# Patient Record
Sex: Male | Born: 1960 | Race: Black or African American | Hispanic: No | Smoking: Never smoker
Health system: Southern US, Community
[De-identification: ages and names within clinical notes are randomized; demographics above are authoritative.]

## PROBLEM LIST (undated history)

## (undated) DIAGNOSIS — E1165 Type 2 diabetes mellitus with hyperglycemia: Secondary | ICD-10-CM

## (undated) DIAGNOSIS — I10 Essential (primary) hypertension: Secondary | ICD-10-CM

## (undated) DIAGNOSIS — E785 Hyperlipidemia, unspecified: Secondary | ICD-10-CM

## (undated) DIAGNOSIS — IMO0002 Reserved for concepts with insufficient information to code with codable children: Secondary | ICD-10-CM

## (undated) DIAGNOSIS — T7840XA Allergy, unspecified, initial encounter: Secondary | ICD-10-CM

## (undated) HISTORY — DX: Type 2 diabetes mellitus with hyperglycemia: E11.65

## (undated) HISTORY — DX: Hyperlipidemia, unspecified: E78.5

## (undated) HISTORY — DX: Essential (primary) hypertension: I10

## (undated) HISTORY — PX: NO PAST SURGERIES: SHX2092

## (undated) HISTORY — DX: Reserved for concepts with insufficient information to code with codable children: IMO0002

## (undated) HISTORY — DX: Allergy, unspecified, initial encounter: T78.40XA

---

## 2014-08-15 ENCOUNTER — Ambulatory Visit (INDEPENDENT_AMBULATORY_CARE_PROVIDER_SITE_OTHER): Payer: BLUE CROSS/BLUE SHIELD | Admitting: Internal Medicine

## 2014-08-15 VITALS — BP 126/78 | HR 75 | Temp 97.8°F | Resp 18 | Ht 73.5 in | Wt 221.0 lb

## 2014-08-15 DIAGNOSIS — Z136 Encounter for screening for cardiovascular disorders: Secondary | ICD-10-CM

## 2014-08-15 DIAGNOSIS — Z1211 Encounter for screening for malignant neoplasm of colon: Secondary | ICD-10-CM

## 2014-08-15 DIAGNOSIS — Z125 Encounter for screening for malignant neoplasm of prostate: Secondary | ICD-10-CM

## 2014-08-15 DIAGNOSIS — Z130 Encounter for screening for diseases of the blood and blood-forming organs and certain disorders involving the immune mechanism: Secondary | ICD-10-CM

## 2014-08-15 DIAGNOSIS — Z0000 Encounter for general adult medical examination without abnormal findings: Secondary | ICD-10-CM

## 2014-08-15 DIAGNOSIS — Z13220 Encounter for screening for lipoid disorders: Secondary | ICD-10-CM

## 2014-08-15 DIAGNOSIS — Z23 Encounter for immunization: Secondary | ICD-10-CM

## 2014-08-15 LAB — POCT URINALYSIS DIPSTICK
Bilirubin, UA: NEGATIVE
Blood, UA: NEGATIVE
GLUCOSE UA: NEGATIVE
KETONES UA: NEGATIVE
LEUKOCYTES UA: NEGATIVE
NITRITE UA: NEGATIVE
Protein, UA: NEGATIVE
SPEC GRAV UA: 1.02
Urobilinogen, UA: 0.2
pH, UA: 7

## 2014-08-15 LAB — POCT CBC
Granulocyte percent: 52.3 %G (ref 37–80)
HEMATOCRIT: 44.8 % (ref 43.5–53.7)
HEMOGLOBIN: 14.6 g/dL (ref 14.1–18.1)
Lymph, poc: 2.7 (ref 0.6–3.4)
MCH: 28.8 pg (ref 27–31.2)
MCHC: 32.5 g/dL (ref 31.8–35.4)
MCV: 88.6 fL (ref 80–97)
MID (CBC): 0.5 (ref 0–0.9)
MPV: 7.2 fL (ref 0–99.8)
POC Granulocyte: 3.6 (ref 2–6.9)
POC LYMPH PERCENT: 40.3 %L (ref 10–50)
POC MID %: 7.4 % (ref 0–12)
Platelet Count, POC: 237 10*3/uL (ref 142–424)
RBC: 5.06 M/uL (ref 4.69–6.13)
RDW, POC: 13.9 %
WBC: 6.8 10*3/uL (ref 4.6–10.2)

## 2014-08-15 LAB — COMPREHENSIVE METABOLIC PANEL
ALBUMIN: 4.4 g/dL (ref 3.5–5.2)
ALK PHOS: 46 U/L (ref 39–117)
ALT: 43 U/L (ref 0–53)
AST: 25 U/L (ref 0–37)
BILIRUBIN TOTAL: 0.6 mg/dL (ref 0.2–1.2)
BUN: 11 mg/dL (ref 6–23)
CALCIUM: 9.4 mg/dL (ref 8.4–10.5)
CHLORIDE: 101 meq/L (ref 96–112)
CO2: 26 mEq/L (ref 19–32)
Creat: 0.91 mg/dL (ref 0.50–1.35)
GLUCOSE: 79 mg/dL (ref 70–99)
Potassium: 4 mEq/L (ref 3.5–5.3)
Sodium: 138 mEq/L (ref 135–145)
TOTAL PROTEIN: 7.8 g/dL (ref 6.0–8.3)

## 2014-08-15 LAB — LIPID PANEL
Cholesterol: 169 mg/dL (ref 0–200)
HDL: 43 mg/dL (ref >39)
LDL Cholesterol: 98 mg/dL (ref 0–99)
TRIGLYCERIDES: 139 mg/dL (ref <150)
Total CHOL/HDL Ratio: 3.9 Ratio
VLDL: 28 mg/dL (ref 0–40)

## 2014-08-15 LAB — POCT UA - MICROSCOPIC ONLY
Bacteria, U Microscopic: NEGATIVE
Casts, Ur, LPF, POC: NEGATIVE
Crystals, Ur, HPF, POC: NEGATIVE
EPITHELIAL CELLS, URINE PER MICROSCOPY: NEGATIVE
MUCUS UA: NEGATIVE
RBC, urine, microscopic: NEGATIVE
WBC, UR, HPF, POC: NEGATIVE
Yeast, UA: NEGATIVE

## 2014-08-15 LAB — TSH: TSH: 8.323 u[IU]/mL — ABNORMAL HIGH (ref 0.350–4.500)

## 2014-08-15 NOTE — Progress Notes (Signed)
Subjective:    Patient ID: Larry Mclean, male    DOB: 05-May-1961, 54 y.o.   MRN: [id number]  HPI Healthy hx. From [place], [place]. Has lost weight exercising.   Review of Systems  Constitutional: Negative.   HENT: Negative.   Eyes: Negative.   Respiratory: Negative.   Cardiovascular: Negative.   Gastrointestinal: Negative.   Endocrine: Negative.   Genitourinary: Negative.   Musculoskeletal: Negative.   Skin: Negative.   Allergic/Immunologic: Negative.   Neurological: Negative.   Hematological: Negative.   Psychiatric/Behavioral: Negative.        Objective:   Physical Exam  Constitutional: He is oriented to person, place, and time. He appears well-developed and well-nourished. No distress.  HENT:  Head: Normocephalic and atraumatic.  Right Ear: External ear normal.  Left Ear: External ear normal.  Nose: Nose normal.  Mouth/Throat: Oropharynx is clear and moist.  Eyes: Conjunctivae and EOM are normal. Pupils are equal, round, and reactive to light.  Neck: Normal range of motion. Neck supple. No tracheal deviation present. No thyromegaly present.  Cardiovascular: Normal rate, regular rhythm, normal heart sounds and intact distal pulses.   No murmur heard. Pulmonary/Chest: Effort normal and breath sounds normal. He exhibits no tenderness.  Abdominal: Soft. Bowel sounds are normal. He exhibits mass. There is no tenderness.  Genitourinary: Rectum normal, prostate normal and penis normal.  Musculoskeletal: Normal range of motion.  Lymphadenopathy:    He has no cervical adenopathy.  Neurological: He is alert and oriented to person, place, and time. No cranial nerve deficit. He exhibits normal muscle tone. Coordination normal.  Skin: No rash noted.  Psychiatric: He has a normal mood and affect. His behavior is normal. Judgment and thought content normal.  Vitals reviewed.  EKG normal  Results for orders placed or performed in visit on 08/15/14  POCT CBC  Result  Value Ref Range   WBC 6.8 4.6 - 10.2 K/uL   Lymph, poc 2.7 0.6 - 3.4   POC LYMPH PERCENT 40.3 10 - 50 %L   MID (cbc) 0.5 0 - 0.9   POC MID % 7.4 0 - 12 %M   POC Granulocyte 3.6 2 - 6.9   Granulocyte percent 52.3 37 - 80 %G   RBC 5.06 4.69 - 6.13 M/uL   Hemoglobin 14.6 14.1 - 18.1 g/dL   HCT, POC 40.9 81.1 - 53.7 %   MCV 88.6 80 - 97 fL   MCH, POC 28.8 27 - 31.2 pg   MCHC 32.5 31.8 - 35.4 g/dL   RDW, POC 91.4 %   Platelet Count, POC 237 142 - 424 K/uL   MPV 7.2 0 - 99.8 fL  POCT UA - Microscopic Only  Result Value Ref Range   WBC, Ur, HPF, POC neg    RBC, urine, microscopic neg    Bacteria, U Microscopic neg    Mucus, UA neg    Epithelial cells, urine per micros neg    Crystals, Ur, HPF, POC neg    Casts, Ur, LPF, POC neg    Yeast, UA neg   POCT urinalysis dipstick  Result Value Ref Range   Color, UA yellow    Clarity, UA clear    Glucose, UA neg    Bilirubin, UA neg    Ketones, UA neg    Spec Grav, UA 1.020    Blood, UA neg    pH, UA 7.0    Protein, UA neg    Urobilinogen, UA 0.2    Nitrite,  UA neg    Leukocytes, UA Negative           Assessment & Plan:  Healthy exam

## 2014-08-15 NOTE — Patient Instructions (Signed)
Colonoscopy A colonoscopy is an exam to look at the entire large intestine (colon). This exam can help find problems such as tumors, polyps, inflammation, and areas of bleeding. The exam takes about 1 hour.  LET [facility] CARE PROVIDER KNOW ABOUT:   Any allergies you have.  All medicines you are taking, including vitamins, herbs, eye drops, creams, and over-the-counter medicines.  Previous problems you or members of your family have had with the use of anesthetics.  Any blood disorders you have.  Previous surgeries you have had.  Medical conditions you have. RISKS AND COMPLICATIONS  Generally, this is a safe procedure. However, as with any procedure, complications can occur. Possible complications include:  Bleeding.  Tearing or rupture of the colon wall.  Reaction to medicines given during the exam.  Infection (rare). BEFORE THE PROCEDURE   Ask your health care provider about changing or stopping your regular medicines.  You may be prescribed an oral bowel prep. This involves drinking a large amount of medicated liquid, starting the day before your procedure. The liquid will cause you to have multiple loose stools until your stool is almost clear or light green. This cleans out your colon in preparation for the procedure.  Do not eat or drink anything else once you have started the bowel prep, unless your health care provider tells you it is safe to do so.  Arrange for someone to drive you home after the procedure. PROCEDURE   You will be given medicine to help you relax (sedative).  You will lie on your side with your knees bent.  A long, flexible tube with a light and camera on the end (colonoscope) will be inserted through the rectum and into the colon. The camera sends video back to a computer screen as it moves through the colon. The colonoscope also releases carbon dioxide gas to inflate the colon. This helps your health care provider see the area better.  During  the exam, your health care provider may take a small tissue sample (biopsy) to be examined under a microscope if any abnormalities are found.  The exam is finished when the entire colon has been viewed. AFTER THE PROCEDURE   Do not drive for 24 hours after the exam.  You may have a small amount of blood in your stool.  You may pass moderate amounts of gas and have mild abdominal cramping or bloating. This is caused by the gas used to inflate your colon during the exam.  Ask when your test results will be ready and how you will get your results. Make sure you get your test results. Document Released: 07/11/2000 Document Revised: 05/04/2013 Document Reviewed: 03/21/2013 [facility] Patient Information 2015 [place], [place]. This information is not intended to replace advice given to you by your health care provider. Make sure you discuss any questions you have with your health care provider. DASH Eating Plan DASH stands for "Dietary Approaches to Stop Hypertension." The DASH eating plan is a healthy eating plan that has been shown to reduce high blood pressure (hypertension). Additional health benefits may include reducing the risk of type 2 diabetes mellitus, heart disease, and stroke. The DASH eating plan may also help with weight loss. WHAT DO I NEED TO KNOW ABOUT THE DASH EATING PLAN? For the DASH eating plan, you will follow these general guidelines:  Choose foods with a percent daily value for sodium of less than 5% (as listed on the food label).  Use salt-free seasonings or herbs instead of  table salt or sea salt.  Check with your health care provider or pharmacist before using salt substitutes.  Eat lower-sodium products, often labeled as "lower sodium" or "no salt added."  Eat fresh foods.  Eat more vegetables, fruits, and low-fat dairy products.  Choose whole grains. Look for the word "whole" as the first word in the ingredient list.  Choose fish and skinless chicken or [place]  more often than red meat. Limit fish, poultry, and meat to 6 oz (170 g) each day.  Limit sweets, desserts, sugars, and sugary drinks.  Choose heart-healthy fats.  Limit cheese to 1 oz (28 g) per day.  Eat more home-cooked food and less restaurant, buffet, and fast food.  Limit fried foods.  Cook foods using methods other than frying.  Limit canned vegetables. If you do use them, rinse them well to decrease the sodium.  When eating at a restaurant, ask that your food be prepared with less salt, or no salt if possible. WHAT FOODS CAN I EAT? Seek help from a dietitian for individual calorie needs. Grains Whole grain or whole wheat bread. Brown rice. Whole grain or whole wheat pasta. Quinoa, bulgur, and whole grain cereals. Low-sodium cereals. Corn or whole wheat flour tortillas. Whole grain cornbread. Whole grain crackers. Low-sodium crackers. Vegetables Fresh or frozen vegetables (raw, steamed, roasted, or grilled). Low-sodium or reduced-sodium tomato and vegetable juices. Low-sodium or reduced-sodium tomato sauce and paste. Low-sodium or reduced-sodium canned vegetables.  Fruits All fresh, canned (in natural juice), or frozen fruits. Meat and Other Protein Products Ground beef (85% or leaner), grass-fed beef, or beef trimmed of fat. Skinless chicken or [place]. Ground chicken or [place]. Pork trimmed of fat. All fish and seafood. Eggs. Dried beans, peas, or lentils. Unsalted nuts and seeds. Unsalted canned beans. Dairy Low-fat dairy products, such as skim or 1% milk, 2% or reduced-fat cheeses, low-fat ricotta or cottage cheese, or plain low-fat yogurt. Low-sodium or reduced-sodium cheeses. Fats and Oils Tub margarines without trans fats. Light or reduced-fat mayonnaise and salad dressings (reduced sodium). Avocado. Safflower, olive, or canola oils. Natural peanut or almond butter. Other Unsalted popcorn and pretzels. The items listed above may not be a complete list of recommended foods  or beverages. Contact your dietitian for more options. WHAT FOODS ARE NOT RECOMMENDED? Grains White bread. White pasta. White rice. Refined cornbread. Bagels and croissants. Crackers that contain trans fat. Vegetables Creamed or fried vegetables. Vegetables in a cheese sauce. Regular canned vegetables. Regular canned tomato sauce and paste. Regular tomato and vegetable juices. Fruits Dried fruits. Canned fruit in light or heavy syrup. Fruit juice. Meat and Other Protein Products Fatty cuts of meat. Ribs, chicken wings, bacon, sausage, bologna, salami, chitterlings, fatback, hot dogs, bratwurst, and packaged luncheon meats. Salted nuts and seeds. Canned beans with salt. Dairy Whole or 2% milk, cream, half-and-half, and cream cheese. Whole-fat or sweetened yogurt. Full-fat cheeses or blue cheese. Nondairy creamers and whipped toppings. Processed cheese, cheese spreads, or cheese curds. Condiments Onion and garlic salt, seasoned salt, table salt, and sea salt. Canned and packaged gravies. Worcestershire sauce. Tartar sauce. Barbecue sauce. Teriyaki sauce. Soy sauce, including reduced sodium. Steak sauce. Fish sauce. Oyster sauce. Cocktail sauce. Horseradish. Ketchup and mustard. Meat flavorings and tenderizers. Bouillon cubes. Hot sauce. Tabasco sauce. Marinades. Taco seasonings. Relishes. Fats and Oils Butter, stick margarine, lard, shortening, ghee, and bacon fat. Coconut, palm kernel, or palm oils. Regular salad dressings. Other Pickles and olives. Salted popcorn and pretzels. The items listed above may not  be a complete list of foods and beverages to avoid. Contact your dietitian for more information. WHERE CAN I FIND MORE INFORMATION? National Heart, Lung, and Blood Institute: CablePromo.itwww.nhlbi.nih.gov/health/health-topics/topics/dash/ Document Released: 07/03/2011 Document Revised: 11/28/2013 Document Reviewed: 05/18/2013 [facility] Patient Information 2015 [place], [place]. This information is not  intended to replace advice given to you by your health care provider. Make sure you discuss any questions you have with your health care provider.

## 2014-08-16 LAB — PSA: PSA: 0.59 ng/mL (ref <=4.00)

## 2014-10-04 ENCOUNTER — Ambulatory Visit (INDEPENDENT_AMBULATORY_CARE_PROVIDER_SITE_OTHER): Payer: BLUE CROSS/BLUE SHIELD | Admitting: Family Medicine

## 2014-10-04 VITALS — BP 120/80 | HR 78 | Temp 98.0°F | Resp 16 | Ht 75.25 in | Wt 228.0 lb

## 2014-10-04 DIAGNOSIS — S39011A Strain of muscle, fascia and tendon of abdomen, initial encounter: Secondary | ICD-10-CM

## 2014-10-04 DIAGNOSIS — R946 Abnormal results of thyroid function studies: Secondary | ICD-10-CM

## 2014-10-04 DIAGNOSIS — K0510 Chronic gingivitis, plaque induced: Secondary | ICD-10-CM

## 2014-10-04 DIAGNOSIS — K036 Deposits [accretions] on teeth: Secondary | ICD-10-CM | POA: Diagnosis not present

## 2014-10-04 DIAGNOSIS — R7989 Other specified abnormal findings of blood chemistry: Secondary | ICD-10-CM

## 2014-10-04 LAB — TSH: TSH: 4.4 u[IU]/mL (ref 0.350–4.500)

## 2014-10-04 LAB — T3, FREE: T3, Free: 3.4 pg/mL (ref 2.3–4.2)

## 2014-10-04 LAB — T4, FREE: FREE T4: 1 ng/dL (ref 0.80–1.80)

## 2014-10-04 NOTE — Patient Instructions (Signed)
I will be in touch with your labs asap I think that the pain in your side is due to a muscle strain from your cough.  The blood in your mouth is probably coming from your gums. Your gums and teeth need care by a dentist as soon as yo ucan!   Please contact the dentist of your choice- I have been pleased with my dentist:   [facility] [address] [place], [place] [address] Telephone: [phone]   In the meantime please brush 2-3 time a day with a soft toothbrush and floss daily, use mouthwash.

## 2014-10-04 NOTE — Progress Notes (Signed)
[facility] [address], [place] [address] [phone]  Date:  10/04/2014   Name:  Larry Mclean Heidrick   DOB:  1961-07-09   MRN:  [id number]  PCP:  No PCP Per Patient    Chief Complaint: Flank Pain and Oral Pain   History of Present Illness:  Larry Mclean Beeck is a 54 y.o. very pleasant male patient who presents with the following:  Seen here in January and noted to have an elevated TSH at 8.2.  He was not stared on medication, planned to recheck in 2 months or so.  Will do this for him today.   He also notes a left lower abdominal / flank pain. He only notes it in the morning when he wakes up.  He also notes pain here when he coughs or sneezes.  He has noted this for about one month; it seemed to start when he was ill and was coughing and sneezing a lot.   When he is up and working he feels fine.    He also has noted that he has a lot of spit in his mouth when he wakes up, and there is sometimes blood.  He is not sure if his gums are bleeding.  He has noted this for about one month as well.  All of this seemed to start when he had a cold about a month ago.   He is no longer coughing.    There are no active problems to display for this patient.   Past Medical History  Diagnosis Date  . Allergy     History reviewed. No pertinent past surgical history.  History  Substance Use Topics  . Smoking status: Never Smoker   . Smokeless tobacco: Not on file  . Alcohol Use: No    History reviewed. No pertinent family history.  No Known Allergies  Medication list has been reviewed and updated.  No current outpatient prescriptions on file prior to visit.   No current facility-administered medications on file prior to visit.    Review of Systems:  As per HPI- otherwise negative.   Physical Examination: Filed Vitals:   10/04/14 1626  BP: 120/80  Pulse: 78  Temp: 98 F (36.7 C)  Resp: 16   Filed Vitals:   10/04/14 1626  Height: 6' 3.25" (1.911 m)   Weight: 228 lb (103.42 kg)   Body mass index is 28.32 kg/(m^2). Ideal Body Weight: Weight in (lb) to have BMI = 25: 200.9  GEN: WDWN, NAD, Non-toxic, A & O x 3 HEENT: Atraumatic, Normocephalic. Neck supple. No masses, No LAD.  Bilateral TM wnl, oropharynx normal.  PEERL,EOMI.   He has significant plaque build-up at his gumline and his gums are red and inflamed.   Ears and Nose: No external deformity. CV: RRR, No M/G/R. No JVD. No thrill. No extra heart sounds. PULM: CTA B, no wheezes, crackles, rhonchi. No retractions. No resp. distress. No accessory muscle use. ABD: S, NT, ND, +BS. No rebound. No HSM. EXTR: No c/c/e NEURO Normal gait.  PSYCH: Normally interactive. Conversant. Not depressed or anxious appearing.  Calm demeanor.  Unable to reproduce pain in his side or flank on exam  Assessment and Plan: Elevated TSH - Plan: TSH, T3, Free, T4, Free  Dental plaque  Gum inflammation  Abdominal muscle strain, initial encounter  Await labs.  Reassured about his abdominal strain, asked him to let me know if not better soon Encouraged him to see a dentist asap, and  discussed oral hygiene to help calm down his gum inflammation prior to his visit   Signed Abbe Amsterdam, MD

## 2014-10-05 ENCOUNTER — Encounter: Payer: Self-pay | Admitting: Family Medicine

## 2019-10-03 ENCOUNTER — Ambulatory Visit: Payer: Self-pay | Attending: Internal Medicine

## 2020-02-04 ENCOUNTER — Encounter (HOSPITAL_COMMUNITY): Payer: Self-pay

## 2020-02-04 ENCOUNTER — Inpatient Hospital Stay (HOSPITAL_COMMUNITY)
Admission: EM | Admit: 2020-02-04 | Discharge: 2020-02-09 | DRG: 638 | Disposition: A | Discharge status: Home / Self Care | Payer: 59 | Attending: Internal Medicine | Admitting: Internal Medicine

## 2020-02-04 ENCOUNTER — Inpatient Hospital Stay (HOSPITAL_COMMUNITY): Payer: 59

## 2020-02-04 DIAGNOSIS — E0810 Diabetes mellitus due to underlying condition with ketoacidosis without coma: Secondary | ICD-10-CM

## 2020-02-04 DIAGNOSIS — E785 Hyperlipidemia, unspecified: Secondary | ICD-10-CM | POA: Diagnosis present

## 2020-02-04 DIAGNOSIS — N179 Acute kidney failure, unspecified: Secondary | ICD-10-CM | POA: Diagnosis present

## 2020-02-04 DIAGNOSIS — Z23 Encounter for immunization: Secondary | ICD-10-CM

## 2020-02-04 DIAGNOSIS — E1010 Type 1 diabetes mellitus with ketoacidosis without coma: Principal | ICD-10-CM | POA: Diagnosis present

## 2020-02-04 DIAGNOSIS — E1110 Type 2 diabetes mellitus with ketoacidosis without coma: Secondary | ICD-10-CM | POA: Diagnosis present

## 2020-02-04 DIAGNOSIS — I10 Essential (primary) hypertension: Secondary | ICD-10-CM | POA: Diagnosis present

## 2020-02-04 DIAGNOSIS — Z7984 Long term (current) use of oral hypoglycemic drugs: Secondary | ICD-10-CM

## 2020-02-04 DIAGNOSIS — Z20822 Contact with and (suspected) exposure to covid-19: Secondary | ICD-10-CM | POA: Diagnosis present

## 2020-02-04 DIAGNOSIS — E876 Hypokalemia: Secondary | ICD-10-CM | POA: Diagnosis not present

## 2020-02-04 LAB — BLOOD GAS, ARTERIAL
Acid-base deficit: 29.3 mmol/L — ABNORMAL HIGH (ref 0.0–2.0)
Acid-base deficit: 19.6 mmol/L — ABNORMAL HIGH (ref 0.0–2.0)
Allens test (pass/fail): POSITIVE — AB
Bicarbonate: 2.4 mmol/L — ABNORMAL LOW (ref 20.0–28.0)
Bicarbonate: 6.4 mmol/L — ABNORMAL LOW (ref 20.0–28.0)
Drawn by: 308601
FIO2: 21
O2 Saturation: 96.9 %
O2 Saturation: 97.4 %
Patient temperature: 98.6
Patient temperature: 98.6
pCO2 arterial: 9.3 mmHg — CL (ref 32.0–48.0)
pCO2 arterial: 15.2 mmHg — CL (ref 32.0–48.0)
pH, Arterial: 7.033 — CL (ref 7.350–7.450)
pH, Arterial: 7.245 — ABNORMAL LOW (ref 7.350–7.450)
pO2, Arterial: 140 mmHg — ABNORMAL HIGH (ref 83.0–108.0)
pO2, Arterial: 122 mmHg — ABNORMAL HIGH (ref 83.0–108.0)

## 2020-02-04 LAB — CBC WITH DIFFERENTIAL/PLATELET
Abs Immature Granulocytes: 0.46 10*3/uL — ABNORMAL HIGH (ref 0.00–0.07)
Basophils Absolute: 0.1 10*3/uL (ref 0.0–0.1)
Basophils Relative: 1 %
Eosinophils Absolute: 0 10*3/uL (ref 0.0–0.5)
Eosinophils Relative: 0 %
HCT: 49.9 % (ref 39.0–52.0)
Hemoglobin: 16.1 g/dL (ref 13.0–17.0)
Immature Granulocytes: 3 %
Lymphocytes Relative: 13 %
Lymphs Abs: 2.4 10*3/uL (ref 0.7–4.0)
MCH: 28.9 pg (ref 26.0–34.0)
MCHC: 32.3 g/dL (ref 30.0–36.0)
MCV: 89.6 fL (ref 80.0–100.0)
Monocytes Absolute: 0.9 10*3/uL (ref 0.1–1.0)
Monocytes Relative: 5 %
Neutro Abs: 14.7 10*3/uL — ABNORMAL HIGH (ref 1.7–7.7)
Neutrophils Relative %: 78 %
Platelets: 324 10*3/uL (ref 150–400)
RBC: 5.57 MIL/uL (ref 4.22–5.81)
RDW: 13.7 % (ref 11.5–15.5)
WBC: 18.5 10*3/uL — ABNORMAL HIGH (ref 4.0–10.5)
nRBC: 0 % (ref 0.0–0.2)

## 2020-02-04 LAB — I-STAT CHEM 8, ED
BUN: 18 mg/dL (ref 6–20)
Calcium, Ion: 1.18 mmol/L (ref 1.15–1.40)
Chloride: 113 mmol/L — ABNORMAL HIGH (ref 98–111)
Creatinine, Ser: 1 mg/dL (ref 0.61–1.24)
Glucose, Bld: 275 mg/dL — ABNORMAL HIGH (ref 70–99)
HCT: 51 % (ref 39.0–52.0)
Hemoglobin: 17.3 g/dL — ABNORMAL HIGH (ref 13.0–17.0)
Potassium: 5.1 mmol/L (ref 3.5–5.1)
Sodium: 139 mmol/L (ref 135–145)
TCO2: 8 mmol/L — ABNORMAL LOW (ref 22–32)

## 2020-02-04 LAB — BLOOD GAS, VENOUS
Acid-base deficit: 26.4 mmol/L — ABNORMAL HIGH (ref 0.0–2.0)
Bicarbonate: 5.9 mmol/L — ABNORMAL LOW (ref 20.0–28.0)
O2 Saturation: 43.8 %
Patient temperature: 98.6
pCO2, Ven: 26.1 mmHg — ABNORMAL LOW (ref 44.0–60.0)
pH, Ven: 6.981 — CL (ref 7.250–7.430)
pO2, Ven: 31.3 mmHg — CL (ref 32.0–45.0)

## 2020-02-04 LAB — BASIC METABOLIC PANEL
Anion gap: 21 — ABNORMAL HIGH (ref 5–15)
Anion gap: 18 — ABNORMAL HIGH (ref 5–15)
BUN: 13 mg/dL (ref 6–20)
BUN: 13 mg/dL (ref 6–20)
CO2: 8 mmol/L — ABNORMAL LOW (ref 22–32)
CO2: 10 mmol/L — ABNORMAL LOW (ref 22–32)
Calcium: 8.7 mg/dL — ABNORMAL LOW (ref 8.9–10.3)
Calcium: 8.3 mg/dL — ABNORMAL LOW (ref 8.9–10.3)
Chloride: 115 mmol/L — ABNORMAL HIGH (ref 98–111)
Chloride: 115 mmol/L — ABNORMAL HIGH (ref 98–111)
Creatinine, Ser: 1.31 mg/dL — ABNORMAL HIGH (ref 0.61–1.24)
Creatinine, Ser: 1.23 mg/dL (ref 0.61–1.24)
GFR calc Af Amer: >60 mL/min (ref >60)
GFR calc Af Amer: >60 mL/min (ref >60)
GFR calc non Af Amer: 59 mL/min — ABNORMAL LOW (ref >60)
GFR calc non Af Amer: >60 mL/min (ref >60)
Glucose, Bld: 136 mg/dL — ABNORMAL HIGH (ref 70–99)
Glucose, Bld: 151 mg/dL — ABNORMAL HIGH (ref 70–99)
Potassium: 4.6 mmol/L (ref 3.5–5.1)
Potassium: 4.1 mmol/L (ref 3.5–5.1)
Sodium: 144 mmol/L (ref 135–145)
Sodium: 143 mmol/L (ref 135–145)

## 2020-02-04 LAB — CBC
HCT: 50.3 % (ref 39.0–52.0)
Hemoglobin: 16.3 g/dL (ref 13.0–17.0)
MCH: 29.2 pg (ref 26.0–34.0)
MCHC: 32.4 g/dL (ref 30.0–36.0)
MCV: 90 fL (ref 80.0–100.0)
Platelets: 242 10*3/uL (ref 150–400)
RBC: 5.59 MIL/uL (ref 4.22–5.81)
RDW: 13.8 % (ref 11.5–15.5)
WBC: 17.2 10*3/uL — ABNORMAL HIGH (ref 4.0–10.5)
nRBC: 0 % (ref 0.0–0.2)

## 2020-02-04 LAB — HEMOGLOBIN A1C
Hgb A1c MFr Bld: 14.5 % — ABNORMAL HIGH (ref 4.8–5.6)
Mean Plasma Glucose: 369.45 mg/dL

## 2020-02-04 LAB — COMPREHENSIVE METABOLIC PANEL
ALT: 11 U/L (ref 0–44)
ALT: 15 U/L (ref 0–44)
AST: 26 U/L (ref 15–41)
AST: 17 U/L (ref 15–41)
Albumin: 4.5 g/dL (ref 3.5–5.0)
Albumin: 3.9 g/dL (ref 3.5–5.0)
Alkaline Phosphatase: 77 U/L (ref 38–126)
Alkaline Phosphatase: 73 U/L (ref 38–126)
Anion gap: 21 — ABNORMAL HIGH (ref 5–15)
BUN: 14 mg/dL (ref 6–20)
BUN: 13 mg/dL (ref 6–20)
CO2: <7 mmol/L — ABNORMAL LOW (ref 22–32)
CO2: 6 mmol/L — ABNORMAL LOW (ref 22–32)
Calcium: 9.3 mg/dL (ref 8.9–10.3)
Calcium: 8.4 mg/dL — ABNORMAL LOW (ref 8.9–10.3)
Chloride: 106 mmol/L (ref 98–111)
Chloride: 114 mmol/L — ABNORMAL HIGH (ref 98–111)
Creatinine, Ser: 1.71 mg/dL — ABNORMAL HIGH (ref 0.61–1.24)
Creatinine, Ser: 1.22 mg/dL (ref 0.61–1.24)
GFR calc Af Amer: 50 mL/min — ABNORMAL LOW (ref >60)
GFR calc Af Amer: >60 mL/min (ref >60)
GFR calc non Af Amer: 43 mL/min — ABNORMAL LOW (ref >60)
GFR calc non Af Amer: >60 mL/min (ref >60)
Glucose, Bld: 277 mg/dL — ABNORMAL HIGH (ref 70–99)
Glucose, Bld: 206 mg/dL — ABNORMAL HIGH (ref 70–99)
Potassium: 5.3 mmol/L — ABNORMAL HIGH (ref 3.5–5.1)
Potassium: 4.8 mmol/L (ref 3.5–5.1)
Sodium: 136 mmol/L (ref 135–145)
Sodium: 141 mmol/L (ref 135–145)
Total Bilirubin: 2.1 mg/dL — ABNORMAL HIGH (ref 0.3–1.2)
Total Bilirubin: 1.3 mg/dL — ABNORMAL HIGH (ref 0.3–1.2)
Total Protein: 9.2 g/dL — ABNORMAL HIGH (ref 6.5–8.1)
Total Protein: 8.4 g/dL — ABNORMAL HIGH (ref 6.5–8.1)

## 2020-02-04 LAB — SARS CORONAVIRUS 2 BY RT PCR (HOSPITAL ORDER, PERFORMED IN ~~LOC~~ HOSPITAL LAB): SARS Coronavirus 2: NEGATIVE

## 2020-02-04 LAB — CBG MONITORING, ED
Glucose-Capillary: 261 mg/dL — ABNORMAL HIGH (ref 70–99)
Glucose-Capillary: 229 mg/dL — ABNORMAL HIGH (ref 70–99)

## 2020-02-04 LAB — GLUCOSE, CAPILLARY
Glucose-Capillary: 145 mg/dL — ABNORMAL HIGH (ref 70–99)
Glucose-Capillary: 144 mg/dL — ABNORMAL HIGH (ref 70–99)
Glucose-Capillary: 150 mg/dL — ABNORMAL HIGH (ref 70–99)
Glucose-Capillary: 139 mg/dL — ABNORMAL HIGH (ref 70–99)
Glucose-Capillary: 176 mg/dL — ABNORMAL HIGH (ref 70–99)

## 2020-02-04 LAB — MAGNESIUM: Magnesium: 2.3 mg/dL (ref 1.7–2.4)

## 2020-02-04 LAB — LACTIC ACID, PLASMA
Lactic Acid, Venous: 0.6 mmol/L (ref 0.5–1.9)
Lactic Acid, Venous: 2.4 mmol/L (ref 0.5–1.9)

## 2020-02-04 LAB — PHOSPHORUS: Phosphorus: 4.3 mg/dL (ref 2.5–4.6)

## 2020-02-04 LAB — LIPASE, BLOOD: Lipase: 27 U/L (ref 11–51)

## 2020-02-04 LAB — PROCALCITONIN: Procalcitonin: <0.1 ng/mL

## 2020-02-04 LAB — MRSA PCR SCREENING: MRSA by PCR: NEGATIVE

## 2020-02-04 LAB — BETA-HYDROXYBUTYRIC ACID
Beta-Hydroxybutyric Acid: >8 mmol/L — ABNORMAL HIGH (ref 0.05–0.27)
Beta-Hydroxybutyric Acid: 2.71 mmol/L — ABNORMAL HIGH (ref 0.05–0.27)

## 2020-02-04 LAB — HIV ANTIBODY (ROUTINE TESTING W REFLEX): HIV Screen 4th Generation wRfx: NONREACTIVE

## 2020-02-04 MED ORDER — INSULIN REGULAR(HUMAN) IN NACL 100-0.9 UT/100ML-% IV SOLN
INTRAVENOUS | Status: DC
  Administered 2020-02-04: 4 [IU]/h via INTRAVENOUS
  Filled 2020-02-04: qty 100

## 2020-02-04 MED ORDER — DEXTROSE 50 % IV SOLN: 0.0000 mL | INTRAVENOUS | Status: DC | PRN

## 2020-02-04 MED ORDER — SODIUM CHLORIDE 0.9 % IV SOLN: INTRAVENOUS | Status: DC

## 2020-02-04 MED ORDER — STERILE WATER FOR INJECTION IV SOLN
INTRAVENOUS | Status: DC
  Filled 2020-02-04: qty 150
  Filled 2020-02-04: qty 50
  Filled 2020-02-04 (×2): qty 150
  Filled 2020-02-04: qty 850

## 2020-02-04 MED ORDER — SODIUM CHLORIDE 0.9 % IV BOLUS (SEPSIS)
1000.0000 mL | Freq: Once | INTRAVENOUS | Status: AC
  Administered 2020-02-04: 1000 mL via INTRAVENOUS

## 2020-02-04 MED ORDER — PNEUMOCOCCAL VAC POLYVALENT 25 MCG/0.5ML IJ INJ
0.5000 mL | INJECTION | INTRAMUSCULAR | Status: AC
  Administered 2020-02-09: 0.5 mL via INTRAMUSCULAR
  Filled 2020-02-04 (×2): qty 0.5

## 2020-02-04 MED ORDER — POLYETHYLENE GLYCOL 3350 17 G PO PACK: 17.0000 g | PACK | Freq: Every day | ORAL | Status: DC | PRN

## 2020-02-04 MED ORDER — CHLORHEXIDINE GLUCONATE CLOTH 2 % EX PADS
6.0000 | MEDICATED_PAD | Freq: Every day | CUTANEOUS | Status: DC
  Administered 2020-02-05 – 2020-02-07 (×3): 6 via TOPICAL

## 2020-02-04 MED ORDER — PANTOPRAZOLE SODIUM 40 MG IV SOLR
40.0000 mg | Freq: Every day | INTRAVENOUS | Status: DC
  Administered 2020-02-04 – 2020-02-08 (×5): 40 mg via INTRAVENOUS
  Filled 2020-02-04 (×5): qty 40

## 2020-02-04 MED ORDER — DEXTROSE-NACL 5-0.45 % IV SOLN: INTRAVENOUS | Status: DC

## 2020-02-04 MED ORDER — SODIUM BICARBONATE 8.4 % IV SOLN
50.0000 meq | Freq: Once | INTRAVENOUS | Status: AC
  Administered 2020-02-04: 50 meq via INTRAVENOUS
  Filled 2020-02-04: qty 50

## 2020-02-04 MED ORDER — INSULIN REGULAR(HUMAN) IN NACL 100-0.9 UT/100ML-% IV SOLN: INTRAVENOUS | Status: DC

## 2020-02-04 MED ORDER — SODIUM CHLORIDE 0.9 % IV SOLN: 1000.0000 mL | INTRAVENOUS | Status: DC

## 2020-02-04 MED ORDER — SODIUM CHLORIDE 0.45 % IV BOLUS: 1000.0000 mL | Freq: Once | INTRAVENOUS | Status: DC

## 2020-02-04 MED ORDER — DOCUSATE SODIUM 100 MG PO CAPS: 100.0000 mg | ORAL_CAPSULE | Freq: Two times a day (BID) | ORAL | Status: DC | PRN

## 2020-02-04 MED ORDER — ENOXAPARIN SODIUM 40 MG/0.4ML ~~LOC~~ SOLN
40.0000 mg | SUBCUTANEOUS | Status: DC
  Administered 2020-02-04 – 2020-02-05 (×2): 40 mg via SUBCUTANEOUS
  Filled 2020-02-04 (×2): qty 0.4

## 2020-02-04 MED ORDER — STERILE WATER FOR INJECTION IV SOLN: INTRAVENOUS | Status: DC

## 2020-02-04 NOTE — ED Notes (Signed)
Attempted to call report to ICU, nurse is in a contact room and unable to take report at this time. Asked to call back in a few minutes.

## 2020-02-04 NOTE — ED Notes (Signed)
Was going to attempt report at this time, purple man appeared reading 1240. Will call then.

## 2020-02-04 NOTE — ED Provider Notes (Signed)
Angiocath insertion Performed by: Tilden Fossa  Consent: Verbal consent obtained. Risks and benefits: risks, benefits and alternatives were discussed Time out: Immediately prior to procedure a "time out" was called to verify the correct patient, procedure, equipment, support staff and site/side marked as required.  Preparation: Patient was prepped and draped in the usual sterile fashion.  Vein Location: right hand   Gauge: 20  Normal blood return and flush without difficulty Patient tolerance: Patient tolerated the procedure well with no immediate complications.      Tilden Fossa, MD 02/04/20 [id number]

## 2020-02-04 NOTE — H&P (Signed)
NAME:  Larry Mclean, MRN:  [id number], DOB:  1961/06/10, LOS: 0 ADMISSION DATE:  02/04/2020, CONSULTATION DATE:  02/04/2020 REFERRING MD:  ED doc, CHIEF COMPLAINT: Emesis and feeling poorly  Brief History   59 year old gentleman who was recently diagnosed with diabetes Started on new medications which he started taking about Thursday.  Has been feeling poorly since Thursday Not able to keep food down, weakness denies any fevers, no chills No contact with anybody was been sick When EMS made contact his blood sugar was 260 at the time-was noted to be lethargic, tachycardic and tachypneic  Past Medical History   Past Medical History:  Diagnosis Date  . Allergy   Diabetes   Significant Hospital Events     Consults:  Critical care  Procedures:    Significant Diagnostic Tests:  Lab work significant for severe acidosis  Micro Data:  Blood culture 7/10  Antimicrobials:  None  Interim history/subjective:  Being fluid resuscitated Is awake and alert Complains of just feeling poorly generally  Objective   Blood pressure (!) 149/98, pulse (!) 112, temperature (!) 97 F (36.1 C), temperature source Axillary, resp. rate (!) 27, height 6\' 4"  (1.93 m), weight 95.7 kg, SpO2 99 %.       No intake or output data in the 24 hours ending 02/04/20 1343 Filed Weights   02/04/20 1204  Weight: 95.7 kg    Examination: General: Middle-age gentleman, tachypneic HENT: Dry oral mucosa Lungs: Clear breath sounds bilaterally Cardiovascular: S1-S2 appreciated Abdomen: Soft, bowel sounds appreciated Extremities: No clubbing, no edema Neuro: Awake and alert, oriented x3, moving all extremities GU:   Resolved Hospital Problem list     Assessment & Plan:  Diabetic ketoacidosis -Endo tool management -Already received over 3 L of fluids -Elevated beta hydroxybutyrate- -Severe acidosis -Normal lactate level -Anion gap greater than 20  Type 2 diabetes -Hold oral  medications  Severe metabolic acidosis -Received bicarbonate drip in the ED -Repeat ABG pending  Hyperlipidemia -Will follow  Continue aggressive fluid resuscitation Monitor electrolytes Chest x-ray pending Cultures ordered Urinalysis and cultures  Best practice:  Diet: N.p.o. at present Pain/Anxiety/Delirium protocol (if indicated):  VAP protocol (if indicated):  DVT prophylaxis: SCD, Lovenox GI prophylaxis: Protonix Glucose control: Endo tool Mobility: Bedrest Code Status: Full code Family Communication: Will update Disposition: ICU Labs   CBC: Recent Labs  Lab 02/04/20 1040 02/04/20 1048  WBC 18.5*  --   NEUTROABS 14.7*  --   HGB 16.1 17.3*  HCT 49.9 51.0  MCV 89.6  --   PLT 324  --     Basic Metabolic Panel: Recent Labs  Lab 02/04/20 1040 02/04/20 1048  NA 136 139  K 5.3* 5.1  CL 106 113*  CO2 <7*  --   GLUCOSE 277* 275*  BUN 14 18  CREATININE 1.71* 1.00  CALCIUM 9.3  --   MG 2.3  --    GFR: Estimated Creatinine Clearance: 97.7 mL/min (by C-G formula based on SCr of 1 mg/dL). Recent Labs  Lab 02/04/20 1040 02/04/20 1235  WBC 18.5*  --   LATICACIDVEN  --  0.6    Liver Function Tests: Recent Labs  Lab 02/04/20 1040  AST 26  ALT 11  ALKPHOS 77  BILITOT 2.1*  PROT 9.2*  ALBUMIN 4.5   Recent Labs  Lab 02/04/20 1040  LIPASE 27   No results for input(s): AMMONIA in the last 168 hours.  ABG    Component Value Date/Time   HCO3 5.9 (L)  02/04/2020 1040   TCO2 8 (L) 02/04/2020 1048   ACIDBASEDEF 26.4 (H) 02/04/2020 1040   O2SAT 43.8 02/04/2020 1040     Coagulation Profile: No results for input(s): INR, PROTIME in the last 168 hours.  Cardiac Enzymes: No results for input(s): CKTOTAL, CKMB, CKMBINDEX, TROPONINI in the last 168 hours.  HbA1C: No results found for: HGBA1C  CBG: Recent Labs  Lab 02/04/20 0951 02/04/20 1147  GLUCAP 261* 229*    Review of Systems:   Abdominal discomfort Fatigue  Past Medical History   He,  has a past medical history of Allergy.   Surgical History   History reviewed. No pertinent surgical history.   Social History   reports that he has never smoked. He does not have any smokeless tobacco history on file. He reports that he does not drink alcohol and does not use drugs.   Family History   His family history is not on file.   Allergies No Known Allergies   Home Medications  Prior to Admission medications   Medication Sig Start Date End Date Taking? Authorizing Provider  atorvastatin (LIPITOR) 40 MG tablet Take 40 mg by mouth daily.   Yes [provider]  empagliflozin (JARDIANCE) 25 MG TABS tablet Take 25 mg by mouth daily.   Yes [provider]  metFORMIN (GLUCOPHAGE) 1000 MG tablet Take 1,000 mg by mouth 2 (two) times daily with a meal.   Yes [provider]    The patient is critically ill with multiple organ systems failure and requires high complexity decision making for assessment and support, frequent evaluation and titration of therapies, application of advanced monitoring technologies and extensive interpretation of multiple databases. Critical Care Time devoted to patient care services described in this note independent of APP/resident time (if applicable)  is 40 minutes.   Virl Diamond MD [facility] Personal pager: [phone] If unanswered, please page CCM On-call: #[phone]

## 2020-02-04 NOTE — ED Notes (Signed)
CRITICAL VALUE STICKER  CRITICAL VALUE: VBG pH 6.981; O2- 31.3  RECEIVER (on-site recipient of call): CSimpson, RN   DATE & TIME NOTIFIED: 02/04/20 1114  MESSENGER (representative from lab): Geoffery Spruce  MD NOTIFIED: Madilyn Hook  TIME OF NOTIFICATION: 1116  RESPONSE:  See orders

## 2020-02-04 NOTE — Progress Notes (Signed)
CRITICAL VALUE ALERT  Critical Value:  Ph 7.0  Date & Time Notied:  02/04/2020 1347  Provider Notified: Virl Diamond  Orders Received/Actions taken: sodium bicarb

## 2020-02-04 NOTE — ED Triage Notes (Signed)
Pt presents with c/o vomiting for approx 3 days. Pt is a diabetic. CBG was 260 per EMS. Pt is tachypneic at this time, also c/o abdominal pain. 4mg  Zofran given en route by EMS, 150 ml of NS given.

## 2020-02-04 NOTE — ED Provider Notes (Signed)
[facility]-EMERGENCY DEPT Provider Note   CSN: [id number] Arrival date & time: 02/04/20  4562     History Chief Complaint  Patient presents with  . Emesis    Larry Mclean is a 59 y.o. male with a past medical history significant for diabetes who presents to the ED due to 3 days of nausea and vomiting associated with upper abdominal pain.  He admits to mild blood in his emesis after numerous episodes of nonbloody emesis.  Denies fever and chills.  Denies sick contacts and known Covid exposures.  He notes he has not taken his diabetic medication since Thursday because it causes abdominal pain.  Patient was given 4 mg of Zofran and 150 mL of normal saline in route to the hospital. Glucose per EMS was 260. Patient very lethargic, tachycardic, and tachypneic on initial evaluation.   Level 5 caveat secondary to acuity of condition.  History obtained from patient and past medical records. No interpreter used during encounter.      Past Medical History:  Diagnosis Date  . Allergy     Patient Active Problem List   Diagnosis Date Noted  . DKA (diabetic ketoacidoses) (HCC) 02/04/2020    History reviewed. No pertinent surgical history.     History reviewed. No pertinent family history.  Social History   Tobacco Use  . Smoking status: Never Smoker  Substance Use Topics  . Alcohol use: No    Alcohol/week: 0.0 standard drinks  . Drug use: No    Home Medications Prior to Admission medications   Not on File    Allergies    Patient has no known allergies.  Review of Systems   Review of Systems  Constitutional: Negative for chills and fever.  Respiratory: Negative for shortness of breath.   Cardiovascular: Negative for chest pain.  Gastrointestinal: Positive for abdominal pain, nausea and vomiting. Negative for diarrhea.  All other systems reviewed and are negative.   Physical Exam Updated Vital Signs BP (!) 170/99   Pulse (!) 119   Temp (!) 97  F (36.1 C) (Axillary)   Resp (!) 30   Ht 6\' 4"  (1.93 m)   Wt 95.7 kg   SpO2 100%   BMI 25.68 kg/m   Physical Exam Vitals and nursing note reviewed.  Constitutional:      General: He is in acute distress.     Appearance: He is ill-appearing.     Comments: Lethargic on exam  HENT:     Head: Normocephalic.  Eyes:     Pupils: Pupils are equal, round, and reactive to light.  Cardiovascular:     Rate and Rhythm: Regular rhythm. Tachycardia present.     Pulses: Normal pulses.     Heart sounds: Normal heart sounds. No murmur heard.  No friction rub. No gallop.   Pulmonary:     Breath sounds: Normal breath sounds.     Comments: Tachypneic at 32. Abdominal:     General: Abdomen is flat. Bowel sounds are normal. There is no distension.     Palpations: Abdomen is soft.     Tenderness: There is no abdominal tenderness. There is no guarding or rebound.  Musculoskeletal:     Cervical back: Neck supple.     Comments: No lower extremity edema.  Skin:    General: Skin is warm and dry.  Neurological:     General: No focal deficit present.  Psychiatric:        Mood and Affect: Mood normal.  Behavior: Behavior normal.     ED Results / Procedures / Treatments   Labs (all labs ordered are listed, but only abnormal results are displayed) Labs Reviewed  CBC WITH DIFFERENTIAL/PLATELET - Abnormal; Notable for the following components:      Result Value   WBC 18.5 (*)    Neutro Abs 14.7 (*)    Abs Immature Granulocytes 0.46 (*)    All other components within normal limits  COMPREHENSIVE METABOLIC PANEL - Abnormal; Notable for the following components:   Potassium 5.3 (*)    CO2 <7 (*)    Glucose, Bld 277 (*)    Creatinine, Ser 1.71 (*)    Total Protein 9.2 (*)    Total Bilirubin 2.1 (*)    GFR calc non Af Amer 43 (*)    GFR calc Af Amer 50 (*)    All other components within normal limits  BLOOD GAS, VENOUS - Abnormal; Notable for the following components:   pH, Ven 6.981  (*)    pCO2, Ven 26.1 (*)    pO2, Ven 31.3 (*)    Bicarbonate 5.9 (*)    Acid-base deficit 26.4 (*)    All other components within normal limits  BETA-HYDROXYBUTYRIC ACID - Abnormal; Notable for the following components:   Beta-Hydroxybutyric Acid >8.00 (*)    All other components within normal limits  CBG MONITORING, ED - Abnormal; Notable for the following components:   Glucose-Capillary 261 (*)    All other components within normal limits  I-STAT CHEM 8, ED - Abnormal; Notable for the following components:   Chloride 113 (*)    Glucose, Bld 275 (*)    TCO2 8 (*)    Hemoglobin 17.3 (*)    All other components within normal limits  CBG MONITORING, ED - Abnormal; Notable for the following components:   Glucose-Capillary 229 (*)    All other components within normal limits  SARS CORONAVIRUS 2 BY RT PCR (HOSPITAL ORDER, PERFORMED IN [facility])  CULTURE, BLOOD (ROUTINE X 2)  CULTURE, BLOOD (ROUTINE X 2)  LIPASE, BLOOD  MAGNESIUM  URINALYSIS, ROUTINE W REFLEX MICROSCOPIC  HIV ANTIBODY (ROUTINE TESTING W REFLEX)  CBC  COMPREHENSIVE METABOLIC PANEL  PHOSPHORUS  LACTIC ACID, PLASMA  LACTIC ACID, PLASMA  PROCALCITONIN  URINALYSIS, ROUTINE W REFLEX MICROSCOPIC  BLOOD GAS, ARTERIAL  BASIC METABOLIC PANEL  BASIC METABOLIC PANEL  BASIC METABOLIC PANEL  BASIC METABOLIC PANEL  BETA-HYDROXYBUTYRIC ACID  BETA-HYDROXYBUTYRIC ACID  HEMOGLOBIN A1C    EKG None  Radiology No results found.  Procedures Procedures (including critical care time) CRITICAL CARE Performed by: Mannie Stabile   Total critical care time: 45 minutes  Critical care time was exclusive of separately billable procedures and treating other patients.  Critical care was necessary to treat or prevent imminent or life-threatening deterioration.  Critical care was time spent personally by me on the following activities: development of treatment plan with patient and/or surrogate as well as  nursing, discussions with consultants, evaluation of patient's response to treatment, examination of patient, obtaining history from patient or surrogate, ordering and performing treatments and interventions, ordering and review of laboratory studies, ordering and review of radiographic studies, pulse oximetry and re-evaluation of patient's condition.  Medications Ordered in ED Medications  sodium chloride 0.9 % bolus 1,000 mL (0 mLs Intravenous Stopped 02/04/20 1139)    Followed by  sodium chloride 0.9 % bolus 1,000 mL (1,000 mLs Intravenous New Bag/Given 02/04/20 1038)    Followed by  sodium chloride 0.9 % bolus 1,000 mL (1,000 mLs Intravenous New Bag/Given 02/04/20 1036)    Followed by  0.9 %  sodium chloride infusion (has no administration in time range)  insulin regular, human (MYXREDLIN) 100 units/ 100 mL infusion (has no administration in time range)  dextrose 5 %-0.45 % sodium chloride infusion (has no administration in time range)  dextrose 50 % solution 0-50 mL (has no administration in time range)  0.9 %  sodium chloride infusion (has no administration in time range)  docusate sodium (COLACE) capsule 100 mg (has no administration in time range)  polyethylene glycol (MIRALAX / GLYCOLAX) packet 17 g (has no administration in time range)  enoxaparin (LOVENOX) injection 40 mg (has no administration in time range)  pantoprazole (PROTONIX) injection 40 mg (has no administration in time range)  0.9 %  sodium chloride infusion ( Intravenous Not Given 02/04/20 1202)  dextrose 5 %-0.45 % sodium chloride infusion ( Intravenous Not Given 02/04/20 1203)  insulin regular, human (MYXREDLIN) 100 units/ 100 mL infusion ( Intravenous Not Given 02/04/20 1203)  dextrose 50 % solution 0-50 mL (has no administration in time range)  sodium bicarbonate injection 50 mEq (50 mEq Intravenous Given 02/04/20 1137)    ED Course  I have reviewed the triage vital signs and the nursing notes.  Pertinent labs &  imaging results that were available during my care of the patient were reviewed by me and considered in my medical decision making (see chart for details).  Clinical Course as of Feb 04 1204  Sat Feb 04, 2020  0958 Glucose-Capillary(!): 261 [CA]  1010 Temp(!): 97 F (36.1 C) [CA]  1010 Pulse Rate(!): 117 [CA]  1010 Resp(!): 32 [CA]  1010 BP(!): 153/96 [CA]  1041 WBC(!): 18.5 [CA]  1104 Glucose(!): 275 [CA]  1105 Chloride(!): 113 [CA]  1116 pH, Ven(!!): 6.981 [CA]  1116 pO2, Ven(!!): 31.3 [CA]  1124 Potassium(!): 5.3 [CA]  1129 Creatinine(!): 1.71 [CA]  1130 Glucose(!): 277 [CA]    Clinical Course User Index [CA] Mannie StabileAberman, Curtez Brallier C, PA-C   MDM Rules/Calculators/A&P                         59 year old male presents to the ED via EMS due to abdominal pain associate with nausea and vomiting for the past 3 days.  Patient has a history of diabetes and has not taken his diabetic medication since Thursday.  Upon arrival patient tachycardic at 117, tachypneic at 32.  Patient is ill-appearing and lethargic on exam. Suspect symptoms related to possible DKA. Routine labs orders and 3L bolus given. Will assess labs then given insulin once potassium level is available. Discussed case with Dr. Madilyn Hookees directly after initial evaluation who evaluated patient and agrees with assessment and plan.   11:03 AM reassessed patient at bedside.  Patient's mentation has drastically improved.  Patient admits to improvement in symptoms.  Patient able to answer questions appropriately.  Vitals have mildly improved however, patient still mildly tachycardic and tachypneic. Will continue to monitor.   VBG suggestive of DKA with severe acidosis with ph at 6.9. CBC significant for leukocytosis at 18.5 likely due to stress reaction.  Discussed case with Dr. Virl DiamondAdewale Olalere with critical care who agrees to admit patient for further treatment. COVID test ordered.   Potassium 5.3. Insulin started.   Final Clinical  Impression(s) / ED Diagnoses Final diagnoses:  Diabetic ketoacidosis without coma associated with type 2 diabetes mellitus (HCC)    Rx /  DC Orders ED Discharge Orders    None       Jesusita Oka 02/04/20 1205    Tilden Fossa, MD 02/06/20 [phone]

## 2020-02-04 NOTE — Progress Notes (Signed)
CRITICAL VALUE ALERT  Critical Value:  lactic  Date & Time Notied:  02/04/2020 1624  Provider Notified: olalere  Orders Received/Actions taken: noted

## 2020-02-04 NOTE — ED Notes (Signed)
PT aware of urine sample. Urinal in hand  

## 2020-02-05 ENCOUNTER — Encounter (HOSPITAL_COMMUNITY): Payer: Self-pay | Admitting: Pulmonary Disease

## 2020-02-05 ENCOUNTER — Other Ambulatory Visit: Payer: Self-pay

## 2020-02-05 LAB — GLUCOSE, CAPILLARY
Glucose-Capillary: 128 mg/dL — ABNORMAL HIGH (ref 70–99)
Glucose-Capillary: 139 mg/dL — ABNORMAL HIGH (ref 70–99)
Glucose-Capillary: 140 mg/dL — ABNORMAL HIGH (ref 70–99)
Glucose-Capillary: 142 mg/dL — ABNORMAL HIGH (ref 70–99)
Glucose-Capillary: 145 mg/dL — ABNORMAL HIGH (ref 70–99)
Glucose-Capillary: 146 mg/dL — ABNORMAL HIGH (ref 70–99)
Glucose-Capillary: 150 mg/dL — ABNORMAL HIGH (ref 70–99)
Glucose-Capillary: 150 mg/dL — ABNORMAL HIGH (ref 70–99)
Glucose-Capillary: 151 mg/dL — ABNORMAL HIGH (ref 70–99)
Glucose-Capillary: 154 mg/dL — ABNORMAL HIGH (ref 70–99)
Glucose-Capillary: 156 mg/dL — ABNORMAL HIGH (ref 70–99)
Glucose-Capillary: 156 mg/dL — ABNORMAL HIGH (ref 70–99)
Glucose-Capillary: 158 mg/dL — ABNORMAL HIGH (ref 70–99)
Glucose-Capillary: 161 mg/dL — ABNORMAL HIGH (ref 70–99)
Glucose-Capillary: 162 mg/dL — ABNORMAL HIGH (ref 70–99)
Glucose-Capillary: 179 mg/dL — ABNORMAL HIGH (ref 70–99)

## 2020-02-05 LAB — BASIC METABOLIC PANEL
Anion gap: 16 — ABNORMAL HIGH (ref 5–15)
Anion gap: 17 — ABNORMAL HIGH (ref 5–15)
Anion gap: 18 — ABNORMAL HIGH (ref 5–15)
Anion gap: 18 — ABNORMAL HIGH (ref 5–15)
BUN: 12 mg/dL (ref 6–20)
BUN: 12 mg/dL (ref 6–20)
BUN: 10 mg/dL (ref 6–20)
BUN: 9 mg/dL (ref 6–20)
CO2: 15 mmol/L — ABNORMAL LOW (ref 22–32)
CO2: 14 mmol/L — ABNORMAL LOW (ref 22–32)
CO2: 13 mmol/L — ABNORMAL LOW (ref 22–32)
CO2: 16 mmol/L — ABNORMAL LOW (ref 22–32)
Calcium: 8.6 mg/dL — ABNORMAL LOW (ref 8.9–10.3)
Calcium: 8.5 mg/dL — ABNORMAL LOW (ref 8.9–10.3)
Calcium: 8.4 mg/dL — ABNORMAL LOW (ref 8.9–10.3)
Calcium: 8.4 mg/dL — ABNORMAL LOW (ref 8.9–10.3)
Chloride: 111 mmol/L (ref 98–111)
Chloride: 110 mmol/L (ref 98–111)
Chloride: 110 mmol/L (ref 98–111)
Chloride: 105 mmol/L (ref 98–111)
Creatinine, Ser: 1.16 mg/dL (ref 0.61–1.24)
Creatinine, Ser: 1.14 mg/dL (ref 0.61–1.24)
Creatinine, Ser: 1 mg/dL (ref 0.61–1.24)
Creatinine, Ser: 0.95 mg/dL (ref 0.61–1.24)
GFR calc Af Amer: >60 mL/min (ref >60)
GFR calc Af Amer: >60 mL/min (ref >60)
GFR calc Af Amer: >60 mL/min (ref >60)
GFR calc Af Amer: >60 mL/min (ref >60)
GFR calc non Af Amer: >60 mL/min (ref >60)
GFR calc non Af Amer: >60 mL/min (ref >60)
GFR calc non Af Amer: >60 mL/min (ref >60)
GFR calc non Af Amer: >60 mL/min (ref >60)
Glucose, Bld: 153 mg/dL — ABNORMAL HIGH (ref 70–99)
Glucose, Bld: 152 mg/dL — ABNORMAL HIGH (ref 70–99)
Glucose, Bld: 160 mg/dL — ABNORMAL HIGH (ref 70–99)
Glucose, Bld: 144 mg/dL — ABNORMAL HIGH (ref 70–99)
Potassium: 3.4 mmol/L — ABNORMAL LOW (ref 3.5–5.1)
Potassium: 3.9 mmol/L (ref 3.5–5.1)
Potassium: 4.1 mmol/L (ref 3.5–5.1)
Potassium: 2.9 mmol/L — ABNORMAL LOW (ref 3.5–5.1)
Sodium: 142 mmol/L (ref 135–145)
Sodium: 141 mmol/L (ref 135–145)
Sodium: 141 mmol/L (ref 135–145)
Sodium: 139 mmol/L (ref 135–145)

## 2020-02-05 LAB — CBC
HCT: 38.8 % — ABNORMAL LOW (ref 39.0–52.0)
Hemoglobin: 13.5 g/dL (ref 13.0–17.0)
MCH: 28.8 pg (ref 26.0–34.0)
MCHC: 34.8 g/dL (ref 30.0–36.0)
MCV: 82.9 fL (ref 80.0–100.0)
Platelets: 238 10*3/uL (ref 150–400)
RBC: 4.68 MIL/uL (ref 4.22–5.81)
RDW: 13.7 % (ref 11.5–15.5)
WBC: 9.7 10*3/uL (ref 4.0–10.5)
nRBC: 0 % (ref 0.0–0.2)

## 2020-02-05 LAB — BLOOD GAS, ARTERIAL
Acid-base deficit: 12.4 mmol/L — ABNORMAL HIGH (ref 0.0–2.0)
Allens test (pass/fail): POSITIVE — AB
Bicarbonate: 11.3 mmol/L — ABNORMAL LOW (ref 20.0–28.0)
Drawn by: 308601
FIO2: 21
O2 Saturation: 96 %
Patient temperature: 98.6
pCO2 arterial: 21.1 mmHg — ABNORMAL LOW (ref 32.0–48.0)
pH, Arterial: 7.348 — ABNORMAL LOW (ref 7.350–7.450)
pO2, Arterial: 111 mmHg — ABNORMAL HIGH (ref 83.0–108.0)

## 2020-02-05 LAB — URINALYSIS, ROUTINE W REFLEX MICROSCOPIC
Bacteria, UA: NONE SEEN
Bilirubin Urine: NEGATIVE
Glucose, UA: >=500 mg/dL — AB
Ketones, ur: 80 mg/dL — AB
Leukocytes,Ua: NEGATIVE
Nitrite: NEGATIVE
Protein, ur: 30 mg/dL — AB
Specific Gravity, Urine: 1.021 (ref 1.005–1.030)
pH: 5 (ref 5.0–8.0)

## 2020-02-05 LAB — BETA-HYDROXYBUTYRIC ACID
Beta-Hydroxybutyric Acid: 7.02 mmol/L — ABNORMAL HIGH (ref 0.05–0.27)
Beta-Hydroxybutyric Acid: 7.51 mmol/L — ABNORMAL HIGH (ref 0.05–0.27)

## 2020-02-05 MED ORDER — POTASSIUM CHLORIDE 10 MEQ/100ML IV SOLN
10.0000 meq | INTRAVENOUS | Status: AC
  Administered 2020-02-05 (×4): 10 meq via INTRAVENOUS
  Filled 2020-02-05 (×4): qty 100

## 2020-02-05 MED ORDER — ORAL CARE MOUTH RINSE
15.0000 mL | Freq: Two times a day (BID) | OROMUCOSAL | Status: DC
  Administered 2020-02-05 – 2020-02-09 (×8): 15 mL via OROMUCOSAL

## 2020-02-05 NOTE — Progress Notes (Signed)
eLink Physician-Brief Progress Note Patient Name: Larry Mclean DOB: 11-16-1960 MRN: [id number]   Date of Service  02/05/2020  HPI/Events of Note  Request for AM ABG. Patient on bicarb drip. clinically stable, still lethargic. Last ABG improved acidosis.  eICU Interventions  New onset DM in DKA Ordered ABG for AM Continue bicarbonate drip for now     Intervention Category Major Interventions: Acid-Base disturbance - evaluation and management  Darl Pikes 02/05/2020, 12:26 AM

## 2020-02-05 NOTE — Progress Notes (Signed)
Inpatient Diabetes Program Recommendations  AACE/ADA: New Consensus Statement on Inpatient Glycemic Control (2015)  Target Ranges:  Prepandial:   less than 140 mg/dL      Peak postprandial:   less than 180 mg/dL (1-2 hours)      Critically ill patients:  140 - 180 mg/dL   Lab Results  Component Value Date   GLUCAP 156 (H) 02/05/2020   HGBA1C 14.5 (H) 02/04/2020    Review of Glycemic Control  Diabetes history: DM2 Outpatient Diabetes medications: Jardiance 25 mg QD, metformin 1000 mg bid Current orders for Inpatient glycemic control: IV insulin per EndoTool for DKA  CO2 - 13    AG - 18 Betahydroxy - 7.51 Not ready for transition at present HgbA1C - 14.5% - uncontrolled  Inpatient Diabetes Program Recommendations:     Continue on IV insulin until criteria met for discontinuation.  See regarding HgbA1C on 7/12.  Thank you. Rhonda Vaughan, RD, LDN, CDE Inpatient Diabetes Coordinator [phone]    

## 2020-02-05 NOTE — Progress Notes (Signed)
NAME:  Larry Mclean, MRN:  [id number], DOB:  January 11, 1961, LOS: 1 ADMISSION DATE:  02/04/2020, CONSULTATION DATE:  02/04/2020 REFERRING MD:  ED doc, CHIEF COMPLAINT: Emesis and feeling poorly  Brief History   59 year old gentleman who was recently diagnosed with diabetes Started on new medications which he started taking about Thursday.  Has been feeling poorly since Thursday Not able to keep food down, weakness denies any fevers, no chills No contact with anybody was been sick When EMS made contact his blood sugar was 260 at the time-was noted to be lethargic, tachycardic and tachypneic  Past Medical History   Past Medical History:  Diagnosis Date  . Allergy   Diabetes   Significant Hospital Events   Mental status is better  Consults:  Critical care  Procedures:  None  Significant Diagnostic Tests:  Lab work significant for severe acidosis  Micro Data:  Blood culture 7/10-negative at present  Antimicrobials:  None  Interim history/subjective:  Being fluid resuscitated Is awake and alert Feeling a lot better  Objective   Blood pressure (!) 143/60, pulse 81, temperature 98 F (36.7 C), temperature source Oral, resp. rate 15, height 6\' 4"  (1.93 m), weight 92.3 kg, SpO2 98 %.        Intake/Output Summary (Last 24 hours) at 02/05/2020 1228 Last data filed at 02/05/2020 1133 Gross per 24 hour  Intake 6668.52 ml  Output 3725 ml  Net 2943.52 ml   Filed Weights   02/04/20 1204 02/05/20 0500  Weight: 95.7 kg 92.3 kg    Examination: General: Middle-age gentleman, tachypneic is better HENT: Dry oral mucosa Lungs: Clear breath sounds bilaterally Cardiovascular: S1-S2 appreciated Abdomen: Soft, bowel sounds appreciated Extremities: No clubbing, no edema Neuro: Awake and alert, oriented x3, moving all extremities GU:   Resolved Hospital Problem list     Assessment & Plan:  Diabetic ketoacidosis -Endo tool management -Continue fluid resuscitation -Elevated  beta hydroxybutyrate-improving -Severe acidosis -Normal lactate level -Anion gap 17  Type 2 diabetes -Hold oral medications  Severe metabolic acidosis -Received bicarbonate drip in the ED -continue bicarb drip  Hyperlipidemia -Will follow  Continue aggressive fluid resuscitation Monitor electrolytes  No evidence of infection  Best practice:  Diet: Liquids Pain/Anxiety/Delirium protocol (if indicated):  VAP protocol (if indicated):  DVT prophylaxis: SCD, Lovenox GI prophylaxis: Protonix Glucose control: Endo tool Mobility: Bedrest Code Status: Full code Family Communication: Will update Disposition: ICU Labs   CBC: Recent Labs  Lab 02/04/20 1040 02/04/20 1048 02/04/20 1341 02/05/20 0522  WBC 18.5*  --  17.2* 9.7  NEUTROABS 14.7*  --   --   --   HGB 16.1 17.3* 16.3 13.5  HCT 49.9 51.0 50.3 38.8*  MCV 89.6  --  90.0 82.9  PLT 324  --  242 238    Basic Metabolic Panel: Recent Labs  Lab 02/04/20 1040 02/04/20 1048 02/04/20 1300 02/04/20 1848 02/04/20 2215 02/05/20 0236 02/05/20 0522  NA 136   < > 141 144 143 142 141  K 5.3*   < > 4.8 4.6 4.1 3.4* 3.9  CL 106   < > 114* 115* 115* 111 110  CO2 <7*   < > 6* 8* 10* 15* 14*  GLUCOSE 277*   < > 206* 136* 151* 153* 152*  BUN 14   < > 13 13 13 12 12   CREATININE 1.71*   < > 1.22 1.31* 1.23 1.16 1.14  CALCIUM 9.3   < > 8.4* 8.7* 8.3* 8.6* 8.5*  MG 2.3  --   --   --   --   --   --  PHOS  --   --  4.3  --   --   --   --    < > = values in this interval not displayed.   GFR: Estimated Creatinine Clearance: 85.7 mL/min (by C-G formula based on SCr of 1.14 mg/dL). Recent Labs  Lab 02/04/20 1040 02/04/20 1235 02/04/20 1341 02/04/20 1546 02/05/20 0522  PROCALCITON  --  <0.10  --   --   --   WBC 18.5*  --  17.2*  --  9.7  LATICACIDVEN  --  0.6  --  2.4*  --     Liver Function Tests: Recent Labs  Lab 02/04/20 1040 02/04/20 1300  AST 26 17  ALT 11 15  ALKPHOS 77 73  BILITOT 2.1* 1.3*  PROT 9.2* 8.4*   ALBUMIN 4.5 3.9   Recent Labs  Lab 02/04/20 1040  LIPASE 27   No results for input(s): AMMONIA in the last 168 hours.  ABG    Component Value Date/Time   PHART 7.348 (L) 02/05/2020 0525   PCO2ART 21.1 (L) 02/05/2020 0525   PO2ART 111 (H) 02/05/2020 0525   HCO3 11.3 (L) 02/05/2020 0525   TCO2 8 (L) 02/04/2020 1048   ACIDBASEDEF 12.4 (H) 02/05/2020 0525   O2SAT 96.0 02/05/2020 0525     Coagulation Profile: No results for input(s): INR, PROTIME in the last 168 hours.  Cardiac Enzymes: No results for input(s): CKTOTAL, CKMB, CKMBINDEX, TROPONINI in the last 168 hours.  HbA1C: Hgb A1c MFr Bld  Date/Time Value Ref Range Status  02/04/2020 12:35 PM 14.5 (H) 4.8 - 5.6 % Final    Comment:    (NOTE) Pre diabetes:          5.7%-6.4%  Diabetes:              >6.4%  Glycemic control for   <7.0% adults with diabetes     CBG: Recent Labs  Lab 02/05/20 0636 02/05/20 0734 02/05/20 0931 02/05/20 1035 02/05/20 1128  GLUCAP 156* 146* 179* 139* 158*    Review of Systems:   Abdominal discomfort Fatigue  Past Medical History  He,  has a past medical history of Allergy.   Surgical History   History reviewed. No pertinent surgical history.   Social History   reports that he has never smoked. He does not have any smokeless tobacco history on file. He reports that he does not drink alcohol and does not use drugs.   Family History   His family history is not on file.   Allergies No Known Allergies   Home Medications  Prior to Admission medications   Medication Sig Start Date End Date Taking? Authorizing Provider  atorvastatin (LIPITOR) 40 MG tablet Take 40 mg by mouth daily.   Yes [provider]  empagliflozin (JARDIANCE) 25 MG TABS tablet Take 25 mg by mouth daily.   Yes [provider]  metFORMIN (GLUCOPHAGE) 1000 MG tablet Take 1,000 mg by mouth 2 (two) times daily with a meal.   Yes [provider]    The patient is critically ill  with multiple organ systems failure and requires high complexity decision making for assessment and support, frequent evaluation and titration of therapies, application of advanced monitoring technologies and extensive interpretation of multiple databases. Critical Care Time devoted to patient care services described in this note independent of APP/resident time (if applicable)  Is 30 minutes.   Virl Diamond MD [facility] Personal pager: [phone] If unanswered, please page CCM On-call: #  [phone]  

## 2020-02-05 NOTE — Progress Notes (Signed)
[facility] ADULT ICU REPLACEMENT PROTOCOL   The patient does apply for the [facility] Adult ICU Electrolyte Replacment Protocol based on the criteria listed below:   1. Is GFR >/= 30 ml/min? Yes.    Patient's GFR today is >60 2. Is SCr </= 2? Yes.   Patient's SCr is 1.6 ml/kg/hr 3. Did SCr increase >/= 0.5 in 24 hours? 4. Abnormal electrolyte(s): K+ 3.4. Ordered repletion with: protocol 6. If a panic level lab has been reported, has the CCM MD in charge been notified? Yes.  .   Physician:  Nathaniel Man  Lolita Lenz 02/05/2020 5:29 AM

## 2020-02-06 DIAGNOSIS — E1110 Type 2 diabetes mellitus with ketoacidosis without coma: Secondary | ICD-10-CM

## 2020-02-06 LAB — BASIC METABOLIC PANEL
Anion gap: 19 — ABNORMAL HIGH (ref 5–15)
Anion gap: 16 — ABNORMAL HIGH (ref 5–15)
Anion gap: 17 — ABNORMAL HIGH (ref 5–15)
BUN: 7 mg/dL (ref 6–20)
BUN: 7 mg/dL (ref 6–20)
BUN: 7 mg/dL (ref 6–20)
CO2: 15 mmol/L — ABNORMAL LOW (ref 22–32)
CO2: 15 mmol/L — ABNORMAL LOW (ref 22–32)
CO2: 16 mmol/L — ABNORMAL LOW (ref 22–32)
Calcium: 8.3 mg/dL — ABNORMAL LOW (ref 8.9–10.3)
Calcium: 8.1 mg/dL — ABNORMAL LOW (ref 8.9–10.3)
Calcium: 8.6 mg/dL — ABNORMAL LOW (ref 8.9–10.3)
Chloride: 104 mmol/L (ref 98–111)
Chloride: 106 mmol/L (ref 98–111)
Chloride: 105 mmol/L (ref 98–111)
Creatinine, Ser: 0.98 mg/dL (ref 0.61–1.24)
Creatinine, Ser: 0.98 mg/dL (ref 0.61–1.24)
Creatinine, Ser: 1.03 mg/dL (ref 0.61–1.24)
GFR calc Af Amer: >60 mL/min (ref >60)
GFR calc Af Amer: >60 mL/min (ref >60)
GFR calc Af Amer: >60 mL/min (ref >60)
GFR calc non Af Amer: >60 mL/min (ref >60)
GFR calc non Af Amer: >60 mL/min (ref >60)
GFR calc non Af Amer: >60 mL/min (ref >60)
Glucose, Bld: 145 mg/dL — ABNORMAL HIGH (ref 70–99)
Glucose, Bld: 157 mg/dL — ABNORMAL HIGH (ref 70–99)
Glucose, Bld: 133 mg/dL — ABNORMAL HIGH (ref 70–99)
Potassium: 2.3 mmol/L — CL (ref 3.5–5.1)
Potassium: 3 mmol/L — ABNORMAL LOW (ref 3.5–5.1)
Potassium: 3 mmol/L — ABNORMAL LOW (ref 3.5–5.1)
Sodium: 138 mmol/L (ref 135–145)
Sodium: 137 mmol/L (ref 135–145)
Sodium: 138 mmol/L (ref 135–145)

## 2020-02-06 LAB — GLUCOSE, CAPILLARY
Glucose-Capillary: 132 mg/dL — ABNORMAL HIGH (ref 70–99)
Glucose-Capillary: 133 mg/dL — ABNORMAL HIGH (ref 70–99)
Glucose-Capillary: 137 mg/dL — ABNORMAL HIGH (ref 70–99)
Glucose-Capillary: 144 mg/dL — ABNORMAL HIGH (ref 70–99)
Glucose-Capillary: 146 mg/dL — ABNORMAL HIGH (ref 70–99)
Glucose-Capillary: 148 mg/dL — ABNORMAL HIGH (ref 70–99)
Glucose-Capillary: 154 mg/dL — ABNORMAL HIGH (ref 70–99)
Glucose-Capillary: 154 mg/dL — ABNORMAL HIGH (ref 70–99)
Glucose-Capillary: 156 mg/dL — ABNORMAL HIGH (ref 70–99)
Glucose-Capillary: 156 mg/dL — ABNORMAL HIGH (ref 70–99)
Glucose-Capillary: 157 mg/dL — ABNORMAL HIGH (ref 70–99)
Glucose-Capillary: 160 mg/dL — ABNORMAL HIGH (ref 70–99)
Glucose-Capillary: 164 mg/dL — ABNORMAL HIGH (ref 70–99)
Glucose-Capillary: 165 mg/dL — ABNORMAL HIGH (ref 70–99)
Glucose-Capillary: 177 mg/dL — ABNORMAL HIGH (ref 70–99)
Glucose-Capillary: 211 mg/dL — ABNORMAL HIGH (ref 70–99)

## 2020-02-06 LAB — BETA-HYDROXYBUTYRIC ACID: Beta-Hydroxybutyric Acid: >8 mmol/L — ABNORMAL HIGH (ref 0.05–0.27)

## 2020-02-06 MED ORDER — POTASSIUM CHLORIDE CRYS ER 20 MEQ PO TBCR
40.0000 meq | EXTENDED_RELEASE_TABLET | Freq: Once | ORAL | Status: AC
  Administered 2020-02-06: 40 meq via ORAL
  Filled 2020-02-06: qty 2

## 2020-02-06 MED ORDER — POTASSIUM CHLORIDE 10 MEQ/100ML IV SOLN
10.0000 meq | INTRAVENOUS | Status: AC
  Administered 2020-02-06 (×2): 10 meq via INTRAVENOUS
  Filled 2020-02-06 (×2): qty 100

## 2020-02-06 MED ORDER — INSULIN STARTER KIT- PEN NEEDLES (ENGLISH)
1.0000 | Freq: Once | Status: AC
  Administered 2020-02-06: 1
  Filled 2020-02-06 (×2): qty 1

## 2020-02-06 MED ORDER — LIVING WELL WITH DIABETES BOOK
Freq: Once | Status: AC
  Filled 2020-02-06: qty 1

## 2020-02-06 MED ORDER — POTASSIUM CHLORIDE 10 MEQ/100ML IV SOLN
10.0000 meq | INTRAVENOUS | Status: AC
  Administered 2020-02-06 (×3): 10 meq via INTRAVENOUS
  Filled 2020-02-06 (×2): qty 100

## 2020-02-06 MED ORDER — POTASSIUM CHLORIDE 10 MEQ/100ML IV SOLN
10.0000 meq | INTRAVENOUS | Status: AC
  Administered 2020-02-07 (×2): 10 meq via INTRAVENOUS
  Filled 2020-02-06 (×2): qty 100

## 2020-02-06 NOTE — Progress Notes (Addendum)
Inpatient Diabetes Program Recommendations  AACE/ADA: New Consensus Statement on Inpatient Glycemic Control (2015)  Target Ranges:  Prepandial:   less than 140 mg/dL      Peak postprandial:   less than 180 mg/dL (1-2 hours)      Critically ill patients:  140 - 180 mg/dL   Lab Results  Component Value Date   GLUCAP 165 (H) 02/06/2020   HGBA1C 14.5 (H) 02/04/2020    Review of Glycemic Control  Diabetes history: DM 2, new diagnosis on 01/23/20 at [facility] glucose 431 at that time Outpatient Diabetes medications: Just started Jardiance and metformin 7/8 Current orders for Inpatient glycemic control:  IV insulin/Endotool  Inpatient Diabetes Program Recommendations:    continue IV insulin for now still in DKA. When pt on SGLT-2, DKA can last longer  -Consider higher concentration of dextrose in order to clear acidosis  A1c 14.5%, note the oral medications pt was on will not be able bring A1c down to goal unless pt had huge lifestyle modifications to make. Pt may benefit from insulin at time of d/c.   Note no insurance no PCP as he is new to the area, will need follow up at one of our clinics to ideally access our insulin at the [facility] pharmacy, (pt would not have to pay no more than $10, which is cheaper than [employer])  Will try to see pt today if medically appropriate. Would not restart Jardiance at time of d/c.  Thanks,  Christena Deem RN, MSN, BC-ADM Inpatient Diabetes Coordinator Team Pager [phone] (8a-5p)

## 2020-02-06 NOTE — Progress Notes (Signed)
Notified E-Link nurse of continued low potassium. PT unable to tolerate quick runs, so I turned it to have the normal rate. Will continue to assess. New labs will be drawn at 0600.

## 2020-02-06 NOTE — Progress Notes (Signed)
Inpatient Diabetes Program    Spoke with patient about new diabetes diagnosis.  Discussed A1C results (14.5%) and explained what an A1C is. Discussed basic pathophysiology of DM Type 2, basic home care, importance of checking CBGs and maintaining good CBG control to prevent long-term and short-term complications. Reviewed glucose and A1C goals.    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 Pt goes to local gym and be more physically active since he has a sedentary occupation. Reviewed signs and symptoms of hyperglycemia and hypoglycemia along with treatment for both. Discussed impact of nutrition, exercise, stress, sickness, and medications on diabetes control. Discuss in detail carbohydrates, portion sizes, and how many carbs/portions needed per snack/meal. Pt had many questions related to diet.  Ordered Living Well with diabetes booklet and attached nutritional information to AVS. Also sent pt videos on how to operate insulin pen, check glucose, and what to do for hypoglycemia.  Spoke with pt about possibly being prescribed insulin at time of d/c. showed pt how to use insulin pen.  Asked patient to check his glucose 2 times per day (fasting and alternating second check) and to keep a log book of glucose readings and insulin taken. Explained how the doctor he follows up with can use the log book to continue to make insulin adjustments if needed. Patient verbalized understanding of information discussed and he states that he has no further questions at this time related to diabetes.   RNs to provide ongoing basic DM education at bedside with this patient and engage patient to actively check blood glucose and administer insulin injections.   D/c: Glucose meter kit order #[id number] Insulin pen  Metformin Insulin pen needles order # [id number]  Thanks, Tama Headings RN, MSN, BC-ADM Inpatient Diabetes Coordinator Team Pager [phone] (8a-5p)

## 2020-02-06 NOTE — TOC Initial Note (Signed)
Transition of Care [facility]) - Initial/Assessment Note    Patient Details  Name: Larry Mclean MRN: [id number] Date of Birth: [birth year]  Transition of Care (TOC) CM/SW Contact:    Armanda Heritage, RN Phone Number: 02/06/2020, 12:12 PM  Clinical Narrative:  CM saw patient at bedside.  Patient is open to an appointment at [facility] to establish pcp services.  Patient reports he has attempted to apply for medicaid but was informed he would need to be 59 years old (I suspect patient was referring to Medicare) and reports he has purchased an insurance from an agency in [place] but has not received the card.  CM discussed that [facility] has a financial assistance program for which patient will need to apply and this would assist with the cost of his medications.  Patient can continue services with the clinic with his insurance as well once he receives his card.  Should patient discharge mon-fri ha can go to clinic and utilize their pharmacy for prescription fill.  Patient may need MATCH letter should he dc over the weekend.                    Expected Discharge Plan: Home/Self Care Barriers to Discharge: Continued Medical Work up   Patient Goals and CMS Choice Patient states their goals for this hospitalization and ongoing recovery are:: to go home      Expected Discharge Plan and Services Expected Discharge Plan: Home/Self Care   Discharge Planning Services: CM Consult                     DME Arranged: N/A DME Agency: NA       HH Arranged: NA HH Agency: NA        Prior Living Arrangements/Services     Patient language and need for interpreter reviewed:: Yes Do you feel safe going back to the place where you live?: Yes      Need for Family Participation in Patient Care: No (Comment) Care giver support system in place?: Yes (comment)   Criminal Activity/Legal Involvement Pertinent to Current Situation/Hospitalization: No - Comment as needed  Activities of Daily Living Home  Assistive Devices/Equipment: None ADL Screening (condition at time of admission) Patient's cognitive ability adequate to safely complete daily activities?: Yes Is the patient deaf or have difficulty hearing?: No Does the patient have difficulty seeing, even when wearing glasses/contacts?: No Does the patient have difficulty concentrating, remembering, or making decisions?: No Patient able to express need for assistance with ADLs?: Yes Does the patient have difficulty dressing or bathing?: No Independently performs ADLs?: Yes (appropriate for developmental age) Does the patient have difficulty walking or climbing stairs?: No Weakness of Legs: None Weakness of Arms/Hands: None  Permission Sought/Granted                  Emotional Assessment   Attitude/Demeanor/Rapport: Engaged Affect (typically observed): Accepting Orientation: : Oriented to Self, Oriented to Place, Oriented to Situation, Oriented to  Time   Psych Involvement: No (comment)  Admission diagnosis:  DKA (diabetic ketoacidoses) (HCC) [E11.10] Diabetic ketoacidosis without coma associated with type 2 diabetes mellitus (HCC) [E11.10] Patient Active Problem List   Diagnosis Date Noted  . DKA (diabetic ketoacidoses) (HCC) 02/04/2020   PCP:  Patient, No Pcp Per Pharmacy:   [id number] [place], [place] - [address] [place] [address] Phone: [phone] Fax: [phone]     Social Determinants of Health (SDOH) Interventions  Readmission Risk Interventions No flowsheet data found.

## 2020-02-06 NOTE — Progress Notes (Signed)
eLink Physician-Brief Progress Note Patient Name: Larry Mclean DOB: [birth year] MRN: [id number]   Date of Service  02/06/2020  HPI/Events of Note  K+ = 3.0 and Creatinine = 1.03.  eICU Interventions  Will replace K+.      Intervention Category Major Interventions: Electrolyte abnormality - evaluation and management  Lenell Antu 02/06/2020, 10:57 PM

## 2020-02-06 NOTE — Progress Notes (Signed)
NAME:  Larry Mclean, MRN:  [id number], DOB:  July 10, 1961, LOS: 2 ADMISSION DATE:  02/04/2020, CONSULTATION DATE:  02/04/2020 REFERRING MD:  Jerel Shepherd, CHIEF COMPLAINT: Emesis and feeling poorly  Brief History   59 year old gentleman who was recently diagnosed with diabetes.  Started on new medications which he started taking about Thursday 7/8.  Has been feeling poorly since that time -not able to keep food down, weakness denies any fevers, no chills No contact with anybody was been sick. When EMS made contact his blood sugar was 260 at the time-was noted to be lethargic, tachycardic and tachypneic.  Found to have a severe acidosis, concern for DKA.    Past Medical History  DM   Significant Hospital Events   7/10 Admit  7/11 Mental status improved  Consults:  Critical care  Procedures:     Significant Diagnostic Tests:    Micro Data:  COVID 7/10 >> negative  HIV 7/10 >> negative MRSA PCR 7/10 >> negative  BCx2 7/10 >>  Antimicrobials:     Interim history/subjective:  Afebrile On room air  Remains on insulin gtt   Objective   Blood pressure (!) 154/100, pulse 66, temperature 97.9 F (36.6 C), temperature source Oral, resp. rate 15, height 6\' 4"  (1.93 m), weight 94.6 kg, SpO2 100 %.        Intake/Output Summary (Last 24 hours) at 02/06/2020 04/08/2020 Last data filed at 02/06/2020 04/08/2020 Gross per 24 hour  Intake 3383.43 ml  Output 3000 ml  Net 383.43 ml   Filed Weights   02/04/20 1204 02/05/20 0500 02/06/20 0430  Weight: 95.7 kg 92.3 kg 94.6 kg    Examination: General: adult male lying in bed in NAD  HEENT: MM pink/dry, no jvd, anicteric  Neuro: awakens to voice, speech clear, MAE CV: s1s2 rrr, no m/r/g PULM: non-labored on RA, lungs bilaterally clear  GI: soft, bsx4 active  Extremities: warm/dry, no edema  Skin: no rashes or lesions  Resolved Hospital Problem list     Assessment & Plan:   Diabetic Ketoacidosis -continue insulin gtt per EndoTool  -NS  at 19ml/hr  -follow anion gap, beta hydroxybutyrate  Type 2 diabetes -hold home oral medications > note Jardiance can cause ketoacidosis  -consider metformin alone vs insulin post transition off gtt and monitor (he was started on both metformin + jardiance 7/8)  Severe metabolic acidosis -stop bicarbonate gtt, most recent ABG with pH 7.34 -follow CO2 on BMP  -BMP Q8  Hypokalemia -trend K -replace 7/12  Hyperlipidemia -follow    Best practice:  Diet: Liquids Pain/Anxiety/Delirium protocol (if indicated):  VAP protocol (if indicated):  DVT prophylaxis: SCD, Lovenox GI prophylaxis: Protonix Glucose control: Endo tool Mobility: Bedrest Code Status: Full code Family Communication: Patient updated on plan of care 7/12 Disposition: ICU  Labs   CBC: Recent Labs  Lab 02/04/20 1040 02/04/20 1048 02/04/20 1341 02/05/20 0522  WBC 18.5*  --  17.2* 9.7  NEUTROABS 14.7*  --   --   --   HGB 16.1 17.3* 16.3 13.5  HCT 49.9 51.0 50.3 38.8*  MCV 89.6  --  90.0 82.9  PLT 324  --  242 238    Basic Metabolic Panel: Recent Labs  Lab 02/04/20 1040 02/04/20 1048 02/04/20 1300 02/04/20 1848 02/05/20 0236 02/05/20 0522 02/05/20 1352 02/05/20 2208 02/06/20 0606  NA 136   < > 141   < > 142 141 141 139 138  K 5.3*   < > 4.8   < >  3.4* 3.9 4.1 2.9* 2.3*  CL 106   < > 114*   < > 111 110 110 105 104  CO2 <7*   < > 6*   < > 15* 14* 13* 16* 15*  GLUCOSE 277*   < > 206*   < > 153* 152* 160* 144* 145*  BUN 14   < > 13   < > 12 12 10 9 7   CREATININE 1.71*   < > 1.22   < > 1.16 1.14 1.00 0.95 0.98  CALCIUM 9.3   < > 8.4*   < > 8.6* 8.5* 8.4* 8.4* 8.3*  MG 2.3  --   --   --   --   --   --   --   --   PHOS  --   --  4.3  --   --   --   --   --   --    < > = values in this interval not displayed.   GFR: Estimated Creatinine Clearance: 99.6 mL/min (by C-G formula based on SCr of 0.98 mg/dL). Recent Labs  Lab 02/04/20 1040 02/04/20 1235 02/04/20 1341 02/04/20 1546 02/05/20 0522    PROCALCITON  --  <0.10  --   --   --   WBC 18.5*  --  17.2*  --  9.7  LATICACIDVEN  --  0.6  --  2.4*  --     Liver Function Tests: Recent Labs  Lab 02/04/20 1040 02/04/20 1300  AST 26 17  ALT 11 15  ALKPHOS 77 73  BILITOT 2.1* 1.3*  PROT 9.2* 8.4*  ALBUMIN 4.5 3.9   Recent Labs  Lab 02/04/20 1040  LIPASE 27   No results for input(s): AMMONIA in the last 168 hours.  ABG    Component Value Date/Time   PHART 7.348 (L) 02/05/2020 0525   PCO2ART 21.1 (L) 02/05/2020 0525   PO2ART 111 (H) 02/05/2020 0525   HCO3 11.3 (L) 02/05/2020 0525   TCO2 8 (L) 02/04/2020 1048   ACIDBASEDEF 12.4 (H) 02/05/2020 0525   O2SAT 96.0 02/05/2020 0525     Coagulation Profile: No results for input(s): INR, PROTIME in the last 168 hours.  Cardiac Enzymes: No results for input(s): CKTOTAL, CKMB, CKMBINDEX, TROPONINI in the last 168 hours.  HbA1C: Hgb A1c MFr Bld  Date/Time Value Ref Range Status  02/04/2020 12:35 PM 14.5 (H) 4.8 - 5.6 % Final    Comment:    (NOTE) Pre diabetes:          5.7%-6.4%  Diabetes:              >6.4%  Glycemic control for   <7.0% adults with diabetes     CBG: Recent Labs  Lab 02/06/20 0330 02/06/20 0429 02/06/20 0534 02/06/20 0630 02/06/20 0730  GLUCAP 146* 157* 144* 154* 165*     Critical Care Time: 30 minutes  04/08/20, MSN, NP-C [facility] 02/06/2020, 8:21 AM   Please see [email or web address] for pager details.

## 2020-02-07 LAB — GLUCOSE, CAPILLARY
Glucose-Capillary: 125 mg/dL — ABNORMAL HIGH (ref 70–99)
Glucose-Capillary: 131 mg/dL — ABNORMAL HIGH (ref 70–99)
Glucose-Capillary: 133 mg/dL — ABNORMAL HIGH (ref 70–99)
Glucose-Capillary: 142 mg/dL — ABNORMAL HIGH (ref 70–99)
Glucose-Capillary: 144 mg/dL — ABNORMAL HIGH (ref 70–99)
Glucose-Capillary: 144 mg/dL — ABNORMAL HIGH (ref 70–99)
Glucose-Capillary: 146 mg/dL — ABNORMAL HIGH (ref 70–99)
Glucose-Capillary: 148 mg/dL — ABNORMAL HIGH (ref 70–99)
Glucose-Capillary: 149 mg/dL — ABNORMAL HIGH (ref 70–99)

## 2020-02-07 LAB — BASIC METABOLIC PANEL
Anion gap: 14 (ref 5–15)
BUN: 6 mg/dL (ref 6–20)
CO2: 14 mmol/L — ABNORMAL LOW (ref 22–32)
Calcium: 8.5 mg/dL — ABNORMAL LOW (ref 8.9–10.3)
Chloride: 106 mmol/L (ref 98–111)
Creatinine, Ser: 0.9 mg/dL (ref 0.61–1.24)
GFR calc Af Amer: >60 mL/min (ref >60)
GFR calc non Af Amer: >60 mL/min (ref >60)
Glucose, Bld: 155 mg/dL — ABNORMAL HIGH (ref 70–99)
Potassium: 3.1 mmol/L — ABNORMAL LOW (ref 3.5–5.1)
Sodium: 134 mmol/L — ABNORMAL LOW (ref 135–145)

## 2020-02-07 LAB — CBC
HCT: 36.3 % — ABNORMAL LOW (ref 39.0–52.0)
Hemoglobin: 12.7 g/dL — ABNORMAL LOW (ref 13.0–17.0)
MCH: 29.5 pg (ref 26.0–34.0)
MCHC: 35 g/dL (ref 30.0–36.0)
MCV: 84.2 fL (ref 80.0–100.0)
Platelets: 176 10*3/uL (ref 150–400)
RBC: 4.31 MIL/uL (ref 4.22–5.81)
RDW: 13.8 % (ref 11.5–15.5)
WBC: 4.8 10*3/uL (ref 4.0–10.5)
nRBC: 0 % (ref 0.0–0.2)

## 2020-02-07 LAB — BASIC METABOLIC PANEL WITH GFR
Anion gap: 17 — ABNORMAL HIGH (ref 5–15)
BUN: 6 mg/dL (ref 6–20)
CO2: 15 mmol/L — ABNORMAL LOW (ref 22–32)
Calcium: 8.5 mg/dL — ABNORMAL LOW (ref 8.9–10.3)
Chloride: 105 mmol/L (ref 98–111)
Creatinine, Ser: 0.94 mg/dL (ref 0.61–1.24)
GFR calc Af Amer: >60 mL/min
GFR calc non Af Amer: >60 mL/min
Glucose, Bld: 139 mg/dL — ABNORMAL HIGH (ref 70–99)
Potassium: 2.8 mmol/L — ABNORMAL LOW (ref 3.5–5.1)
Sodium: 137 mmol/L (ref 135–145)

## 2020-02-07 LAB — MAGNESIUM: Magnesium: 1.9 mg/dL (ref 1.7–2.4)

## 2020-02-07 LAB — LACTIC ACID, PLASMA: Lactic Acid, Venous: 0.7 mmol/L (ref 0.5–1.9)

## 2020-02-07 LAB — BETA-HYDROXYBUTYRIC ACID: Beta-Hydroxybutyric Acid: 6.32 mmol/L — ABNORMAL HIGH (ref 0.05–0.27)

## 2020-02-07 MED ORDER — POTASSIUM CHLORIDE CRYS ER 20 MEQ PO TBCR
40.0000 meq | EXTENDED_RELEASE_TABLET | Freq: Two times a day (BID) | ORAL | Status: AC
  Administered 2020-02-07 (×2): 40 meq via ORAL
  Filled 2020-02-07 (×2): qty 2

## 2020-02-07 MED ORDER — ATORVASTATIN CALCIUM 40 MG PO TABS
40.0000 mg | ORAL_TABLET | Freq: Every day | ORAL | Status: DC
  Administered 2020-02-07 – 2020-02-09 (×3): 40 mg via ORAL
  Filled 2020-02-07 (×3): qty 1

## 2020-02-07 MED ORDER — POTASSIUM CHLORIDE 10 MEQ/100ML IV SOLN
10.0000 meq | INTRAVENOUS | Status: AC
  Administered 2020-02-07 (×2): 10 meq via INTRAVENOUS
  Filled 2020-02-07 (×2): qty 100

## 2020-02-07 NOTE — Discharge Instructions (Signed)
Glucose Products:  ReliOnT glucose products raise low blood sugar fast. Tablets are free of fat, caffeine, sodium and gluten. They are portable and easy to carry, making it easier for people with diabetes to BE PREPARED for lows.  Glucose Tablets Available in 6 flavors . 10 ct...................................... $1.00 . 50 ct...................................... $3.98 Glucose Shot..................................$1.48 Glucose Gel....................................$3.44  Alcohol Swabs Alcohol swabs are used to sterilize your injection site. All of our swabs are individually wrapped for maximum safety, convenience and moisture retention. ReliOnT Alcohol Swabs . 100 ct Swabs..............................$1.00 . 400 ct Swabs..............................$3.74  Lancets ReliOnT offers three lancet options conveniently designed to work with almost every lancing device. Each features a protective disk, which guarantees sterility before testing. ReliOnT Lancets . 100 ct Lancets $1.56 . 200 ct Lancets $2.64 Available in Ultra-Thin, Thin & Micro-Thin ReliOnT 2-IN-1 Lancing Device . 50 ct Lancets..................................... $3.44 Available in 30 gauge and 25 gauge ReliOnT Lancing Device....................$5.84  Blood Glucose Monitors ReliOnT offers a full range of blood glucose testing options to provide an accurate, affordable system that meets each person's unique needs and preferences. Prime Meter....................................... $9.00 Prime Test Strips . 25 test strips.................................... $5.00 . 50 test strips.................................... $9.00 . 100 test strips.................................$17.88 Premier BLU Meter  ............  $18.98 Premier Voice Meter  .............  $14.98 Premier Test Strips . 50 test strips.................................... $9.00 . 100 test strips.................................$17.88 Premier Compact Meter Kit   ............  $19.44 Kit includes: . 50 test strips . 10 lancets . Lancing device . Carry case   

## 2020-02-07 NOTE — Progress Notes (Addendum)
Inpatient Diabetes Program Recommendations  AACE/ADA: New Consensus Statement on Inpatient Glycemic Control (2015)  Target Ranges:  Prepandial:   less than 140 mg/dL      Peak postprandial:   less than 180 mg/dL (1-2 hours)      Critically ill patients:  140 - 180 mg/dL   Lab Results  Component Value Date   GLUCAP 133 (H) 02/07/2020   HGBA1C 14.5 (H) 02/04/2020    Review of Glycemic Control  Diabetes history: DM 2, [facility] glucose 431 at that time Outpatient Diabetes medications: Just started Jardiance and metformin 7/8 Current orders for Inpatient glycemic control:  IV insulin/Endotool  Inpatient Diabetes Program Recommendations:    continue IV insulin for now still in DKA. When pt on SGLT-2, DKA can last longer  -Consider higher concentration of dextrose in order to clear acidosis  A1c 14.5%, note the oral medications pt was on will not be able bring A1c down to goal. Pt will benefit from basal insulin at time of d/c.   Has appointment at [facility] for follow up and med assist.   Would not restart Jardiance at time of d/c.  Addendum 1045 pm: Spoke with pt again at bedside regarding DM. Showed him how to use insulin pen again. Pt to watch Dm videos I sent him yesterday.  Thanks,  Christena Deem RN, MSN, BC-ADM Inpatient Diabetes Coordinator Team Pager [phone] (8a-5p)

## 2020-02-07 NOTE — Progress Notes (Signed)
NAME:  Larry Mclean, MRN:  [id number], DOB:  29-May-1961, LOS: 3 ADMISSION DATE:  02/04/2020, CONSULTATION DATE:  02/04/2020 REFERRING MD:  Jerel Shepherd, CHIEF COMPLAINT: Emesis and feeling poorly  Brief History   59 year old gentleman who was recently diagnosed with diabetes.  Started on new medications which he started taking about Thursday 7/8.  Has been feeling poorly since that time -not able to keep food down, weakness denies any fevers, no chills No contact with anybody was been sick. When EMS made contact his blood sugar was 260 at the time-was noted to be lethargic, tachycardic and tachypneic.  Found to have a severe acidosis, concern for DKA.    Past Medical History  DM - newly diagnosed in 01/2020   Significant Hospital Events   7/10 Admit  7/11 Mental status improved 7/13 Beta hydroxy remains elevated, low K, on insulin gtt  Consults:  Critical care  Procedures:     Significant Diagnostic Tests:    Micro Data:  COVID 7/10 >> negative  HIV 7/10 >> negative MRSA PCR 7/10 >> negative  BCx2 7/10 >>  Antimicrobials:     Interim history/subjective:  Afebrile  Remains on insulin gtt I/O 2.6L UOP, + in last 24 hours  Pt reports feeling some nausea when taking pills, otherwise ok   Objective   Blood pressure 127/67, pulse 71, temperature 98.5 F (36.9 C), temperature source Oral, resp. rate 17, height 6\' 4"  (1.93 m), weight 94.9 kg, SpO2 100 %.        Intake/Output Summary (Last 24 hours) at 02/07/2020 0823 Last data filed at 02/07/2020 0600 Gross per 24 hour  Intake 2941.41 ml  Output 2600 ml  Net 341.41 ml   Filed Weights   02/05/20 0500 02/06/20 0430 02/07/20 0500  Weight: 92.3 kg 94.6 kg 94.9 kg    Examination: General: adult male lying in bed in NAD   HEENT: MM pink/moist, no jvd, anicteric  Neuro: AAOx4, speech clear, MAE CV: s1s2 rrr, no m/r/g PULM: non-labored on RA, lungs bilaterally clear  GI: soft, bsx4 active  Extremities: warm/dry,  no edema  Skin: no rashes or lesions  Resolved Hospital Problem list     Assessment & Plan:   Diabetic Ketoacidosis -continue insulin gtt per EndoTool  -assess lactic acid  -follow bety-hydroxybutyrate  -continue IVF per protocol   Type 2 DM Recently started on metformin + jardiance 7/8 -insulin gtt as above -hold home oral medications  -will need to determine oral regimen before discharge.    Severe Metabolic Acidosis -assess lactic acid  -follow BMP Q8   Hypokalemia -trend K  -replace 7/13 -assess magnesium   Hyperlipidemia -follow -resume home lipitor   Best practice:  Diet: Liquids Pain/Anxiety/Delirium protocol (if indicated):  VAP protocol (if indicated):  DVT prophylaxis: SCD, Lovenox GI prophylaxis: Protonix Glucose control: Endo tool Mobility: Bedrest Code Status: Full code Family Communication: Patient updated on plan of care 7/13 Disposition: ICU  Labs   CBC: Recent Labs  Lab 02/04/20 1040 02/04/20 1048 02/04/20 1341 02/05/20 0522 02/07/20 0733  WBC 18.5*  --  17.2* 9.7 4.8  NEUTROABS 14.7*  --   --   --   --   HGB 16.1 17.3* 16.3 13.5 12.7*  HCT 49.9 51.0 50.3 38.8* 36.3*  MCV 89.6  --  90.0 82.9 84.2  PLT 324  --  242 238 176    Basic Metabolic Panel: Recent Labs  Lab 02/04/20 1040 02/04/20 1048 02/04/20 1300 02/04/20 1848 02/05/20 2208 02/06/20  5638 02/06/20 1348 02/06/20 2145 02/07/20 0733  NA 136   < > 141   < > 139 138 137 138 137  K 5.3*   < > 4.8   < > 2.9* 2.3* 3.0* 3.0* 2.8*  CL 106   < > 114*   < > 105 104 106 105 105  CO2 <7*   < > 6*   < > 16* 15* 15* 16* 15*  GLUCOSE 277*   < > 206*   < > 144* 145* 157* 133* 139*  BUN 14   < > 13   < > 9 7 7 7 6   CREATININE 1.71*   < > 1.22   < > 0.95 0.98 0.98 1.03 0.94  CALCIUM 9.3   < > 8.4*   < > 8.4* 8.3* 8.1* 8.6* 8.5*  MG 2.3  --   --   --   --   --   --   --   --   PHOS  --   --  4.3  --   --   --   --   --   --    < > = values in this interval not displayed.    GFR: Estimated Creatinine Clearance: 103.9 mL/min (by C-G formula based on SCr of 0.94 mg/dL). Recent Labs  Lab 02/04/20 1040 02/04/20 1235 02/04/20 1341 02/04/20 1546 02/05/20 0522 02/07/20 0733  PROCALCITON  --  <0.10  --   --   --   --   WBC 18.5*  --  17.2*  --  9.7 4.8  LATICACIDVEN  --  0.6  --  2.4*  --   --     Liver Function Tests: Recent Labs  Lab 02/04/20 1040 02/04/20 1300  AST 26 17  ALT 11 15  ALKPHOS 77 73  BILITOT 2.1* 1.3*  PROT 9.2* 8.4*  ALBUMIN 4.5 3.9   Recent Labs  Lab 02/04/20 1040  LIPASE 27   No results for input(s): AMMONIA in the last 168 hours.  ABG    Component Value Date/Time   PHART 7.348 (L) 02/05/2020 0525   PCO2ART 21.1 (L) 02/05/2020 0525   PO2ART 111 (H) 02/05/2020 0525   HCO3 11.3 (L) 02/05/2020 0525   TCO2 8 (L) 02/04/2020 1048   ACIDBASEDEF 12.4 (H) 02/05/2020 0525   O2SAT 96.0 02/05/2020 0525     Coagulation Profile: No results for input(s): INR, PROTIME in the last 168 hours.  Cardiac Enzymes: No results for input(s): CKTOTAL, CKMB, CKMBINDEX, TROPONINI in the last 168 hours.  HbA1C: Hgb A1c MFr Bld  Date/Time Value Ref Range Status  02/04/2020 12:35 PM 14.5 (H) 4.8 - 5.6 % Final    Comment:    (NOTE) Pre diabetes:          5.7%-6.4%  Diabetes:              >6.4%  Glycemic control for   <7.0% adults with diabetes     CBG: Recent Labs  Lab 02/06/20 2250 02/07/20 0043 02/07/20 0250 02/07/20 0452 02/07/20 0646  GLUCAP 137* 131* 125* 148* 133*     Critical Care Time: 30 minutes  02/09/20, MSN, NP-C [facility] 02/07/2020, 8:23 AM   Please see [email or web address] for pager details.

## 2020-02-08 LAB — CBC
HCT: 37.5 % — ABNORMAL LOW (ref 39.0–52.0)
Hemoglobin: 13.2 g/dL (ref 13.0–17.0)
MCH: 29.7 pg (ref 26.0–34.0)
MCHC: 35.2 g/dL (ref 30.0–36.0)
MCV: 84.5 fL (ref 80.0–100.0)
Platelets: 181 10*3/uL (ref 150–400)
RBC: 4.44 MIL/uL (ref 4.22–5.81)
RDW: 13.9 % (ref 11.5–15.5)
WBC: 5.4 10*3/uL (ref 4.0–10.5)
nRBC: 0 % (ref 0.0–0.2)

## 2020-02-08 LAB — BASIC METABOLIC PANEL
Anion gap: 16 — ABNORMAL HIGH (ref 5–15)
Anion gap: 15 (ref 5–15)
Anion gap: 13 (ref 5–15)
BUN: 5 mg/dL — ABNORMAL LOW (ref 6–20)
BUN: <5 mg/dL — ABNORMAL LOW (ref 6–20)
BUN: <5 mg/dL — ABNORMAL LOW (ref 6–20)
CO2: 15 mmol/L — ABNORMAL LOW (ref 22–32)
CO2: 14 mmol/L — ABNORMAL LOW (ref 22–32)
CO2: 16 mmol/L — ABNORMAL LOW (ref 22–32)
Calcium: 8.7 mg/dL — ABNORMAL LOW (ref 8.9–10.3)
Calcium: 8.4 mg/dL — ABNORMAL LOW (ref 8.9–10.3)
Calcium: 8.3 mg/dL — ABNORMAL LOW (ref 8.9–10.3)
Chloride: 106 mmol/L (ref 98–111)
Chloride: 108 mmol/L (ref 98–111)
Chloride: 108 mmol/L (ref 98–111)
Creatinine, Ser: 0.82 mg/dL (ref 0.61–1.24)
Creatinine, Ser: 0.7 mg/dL (ref 0.61–1.24)
Creatinine, Ser: 0.6 mg/dL — ABNORMAL LOW (ref 0.61–1.24)
GFR calc Af Amer: >60 mL/min (ref >60)
GFR calc Af Amer: >60 mL/min (ref >60)
GFR calc Af Amer: >60 mL/min (ref >60)
GFR calc non Af Amer: >60 mL/min (ref >60)
GFR calc non Af Amer: >60 mL/min (ref >60)
GFR calc non Af Amer: >60 mL/min (ref >60)
Glucose, Bld: 154 mg/dL — ABNORMAL HIGH (ref 70–99)
Glucose, Bld: 152 mg/dL — ABNORMAL HIGH (ref 70–99)
Glucose, Bld: 147 mg/dL — ABNORMAL HIGH (ref 70–99)
Potassium: 3.2 mmol/L — ABNORMAL LOW (ref 3.5–5.1)
Potassium: 3.3 mmol/L — ABNORMAL LOW (ref 3.5–5.1)
Potassium: 3.4 mmol/L — ABNORMAL LOW (ref 3.5–5.1)
Sodium: 137 mmol/L (ref 135–145)
Sodium: 137 mmol/L (ref 135–145)
Sodium: 137 mmol/L (ref 135–145)

## 2020-02-08 LAB — GLUCOSE, CAPILLARY
Glucose-Capillary: 132 mg/dL — ABNORMAL HIGH (ref 70–99)
Glucose-Capillary: 136 mg/dL — ABNORMAL HIGH (ref 70–99)
Glucose-Capillary: 142 mg/dL — ABNORMAL HIGH (ref 70–99)
Glucose-Capillary: 142 mg/dL — ABNORMAL HIGH (ref 70–99)
Glucose-Capillary: 142 mg/dL — ABNORMAL HIGH (ref 70–99)
Glucose-Capillary: 143 mg/dL — ABNORMAL HIGH (ref 70–99)
Glucose-Capillary: 143 mg/dL — ABNORMAL HIGH (ref 70–99)
Glucose-Capillary: 143 mg/dL — ABNORMAL HIGH (ref 70–99)
Glucose-Capillary: 145 mg/dL — ABNORMAL HIGH (ref 70–99)
Glucose-Capillary: 146 mg/dL — ABNORMAL HIGH (ref 70–99)
Glucose-Capillary: 147 mg/dL — ABNORMAL HIGH (ref 70–99)
Glucose-Capillary: 149 mg/dL — ABNORMAL HIGH (ref 70–99)
Glucose-Capillary: 150 mg/dL — ABNORMAL HIGH (ref 70–99)
Glucose-Capillary: 153 mg/dL — ABNORMAL HIGH (ref 70–99)
Glucose-Capillary: 154 mg/dL — ABNORMAL HIGH (ref 70–99)

## 2020-02-08 LAB — BETA-HYDROXYBUTYRIC ACID: Beta-Hydroxybutyric Acid: 5.9 mmol/L — ABNORMAL HIGH (ref 0.05–0.27)

## 2020-02-08 MED ORDER — LACTATED RINGERS IV SOLN: INTRAVENOUS | Status: DC

## 2020-02-08 MED ORDER — LACTATED RINGERS IV BOLUS
1000.0000 mL | INTRAVENOUS | Status: AC
  Administered 2020-02-08 (×2): 1000 mL via INTRAVENOUS

## 2020-02-08 MED ORDER — STERILE WATER FOR INJECTION IV SOLN
Freq: Once | INTRAVENOUS | Status: AC
  Filled 2020-02-08: qty 150

## 2020-02-08 MED ORDER — INSULIN ASPART 100 UNIT/ML ~~LOC~~ SOLN: 0.0000 [IU] | Freq: Every day | SUBCUTANEOUS | Status: DC

## 2020-02-08 MED ORDER — INSULIN ASPART 100 UNIT/ML ~~LOC~~ SOLN
0.0000 [IU] | Freq: Three times a day (TID) | SUBCUTANEOUS | Status: DC
  Administered 2020-02-09: 3 [IU] via SUBCUTANEOUS

## 2020-02-08 MED ORDER — POTASSIUM CHLORIDE CRYS ER 20 MEQ PO TBCR
40.0000 meq | EXTENDED_RELEASE_TABLET | ORAL | Status: AC
  Administered 2020-02-08 (×2): 40 meq via ORAL
  Filled 2020-02-08 (×2): qty 2

## 2020-02-08 MED ORDER — INSULIN GLARGINE 100 UNIT/ML ~~LOC~~ SOLN
20.0000 [IU] | SUBCUTANEOUS | Status: DC
  Administered 2020-02-08: 20 [IU] via SUBCUTANEOUS
  Filled 2020-02-08 (×2): qty 0.2

## 2020-02-08 MED ORDER — ENOXAPARIN SODIUM 40 MG/0.4ML ~~LOC~~ SOLN
40.0000 mg | Freq: Every day | SUBCUTANEOUS | Status: DC
  Administered 2020-02-08: 40 mg via SUBCUTANEOUS

## 2020-02-08 MED ORDER — INSULIN ASPART 100 UNIT/ML ~~LOC~~ SOLN
4.0000 [IU] | Freq: Three times a day (TID) | SUBCUTANEOUS | Status: DC
  Administered 2020-02-08: 4 [IU] via SUBCUTANEOUS

## 2020-02-08 NOTE — Progress Notes (Signed)
[facility] Progress Note  Patient: Larry Mclean    TML:[id number]  DOA: 02/04/2020     Date of Service: the patient was seen and examined on 02/08/2020  Brief hospital course: Past medical history of recently diagnosed uncontrolled diabetes.  Presents with DKA.  Initially admitted to the ICU.  Now transitioning off of the IV insulin as of 02/08/2020. Currently plan is continue to monitor closely.  Assessment and Plan: 1.  Diabetic ketoacidosis Type of diabetes mellitus uncontrolled with hyperglycemia Hemoglobin A1c 14. Beta hydroxybutyric acid significantly elevated. Currently anion gap closed after initiation of the bicarb drip. We will continue the IV bicarb for a total of 10 hours. Transitioning to LR starting tomorrow. Based on 95 kg weight transitioning to 0.2 units/kg of insulin 20 units Lantus. We will also continue premeal coverage. Also continue sliding scale insulin moderate. Transition to carb modified diet. Patient will require insulin education. Appreciate diabetes coronary assistance, patient will be on basal bolus regimen outside of the hospital. Will not restart Jardiance at the time of the discharge. Continue Metformin at the time of the discharge.  2.  Hypokalemia Replace.  3.  Hyperlipidemia Resume Lipitor.  Diet: Carb modified diet DVT Prophylaxis: Subcutaneous Lovenox  SCDs Start: 02/04/20 1158    Advance goals of care discussion: Full code  Family Communication: no family was present at bedside, at the time of interview.   Disposition:  Status is: Inpatient  Remains inpatient appropriate because:Ongoing diagnostic testing needed not appropriate for outpatient work up  Dispo: The patient is from: Home              Anticipated d/c is to: Home              Anticipated d/c date is: 1 day              Patient currently is not medically stable to d/c.  Subjective: No acute complaint.  No nausea no vomiting.  No fever no chills.  Physical  Exam:  General: Appear in mild distress, no Rash; Oral Mucosa Clear, moist. no Abnormal Neck Mass Or lumps, Conjunctiva normal  Cardiovascular: S1 and S2 Present, no Murmur, Respiratory: good respiratory effort, Bilateral Air entry present and Clear to Auscultation, no Crackles, no wheezes Abdomen: Bowel Sound present, Soft and no tenderness Extremities: no Pedal edema, no calf tenderness Neurology: alert and oriented to time, place, and person affect appropriate. no new focal deficit Gait not checked due to patient safety concerns  Vitals:   02/08/20 0800 02/08/20 1200 02/08/20 1300 02/08/20 1600  BP: 135/78  137/83 (!) 150/98  Pulse: 70  (!) 109 71  Resp: 17  19 16   Temp: 97.9 F (36.6 C) 98.2 F (36.8 C)  98.1 F (36.7 C)  TempSrc: Oral Oral  Oral  SpO2: 100%  100% 100%  Weight:      Height:        Intake/Output Summary (Last 24 hours) at 02/08/2020 1847 Last data filed at 02/08/2020 1807 Gross per 24 hour  Intake 2156.46 ml  Output 3875 ml  Net -1718.54 ml   Filed Weights   02/06/20 0430 02/07/20 0500 02/08/20 0500  Weight: 94.6 kg 94.9 kg 94.9 kg    Data Reviewed: I have personally reviewed and interpreted daily labs, tele strips, imagings as discussed above. I reviewed all nursing notes, pharmacy notes, vitals, pertinent old records I have discussed plan of care as described above with RN and patient/family.  CBC: Recent Labs  Lab 02/04/20 1040  02/04/20 1040 02/04/20 1048 02/04/20 1341 02/05/20 0522 02/07/20 0733 02/08/20 0800  WBC 18.5*  --   --  17.2* 9.7 4.8 5.4  NEUTROABS 14.7*  --   --   --   --   --   --   HGB 16.1   < > 17.3* 16.3 13.5 12.7* 13.2  HCT 49.9   < > 51.0 50.3 38.8* 36.3* 37.5*  MCV 89.6  --   --  90.0 82.9 84.2 84.5  PLT 324  --   --  242 238 176 181   < > = values in this interval not displayed.   Basic Metabolic Panel: Recent Labs  Lab 02/04/20 1040 02/04/20 1048 02/04/20 1300 02/04/20 1848 02/07/20 0733 02/07/20 1741  02/08/20 0800 02/08/20 1341 02/08/20 1725  NA 136   < > 141   < > 137 134* 137 137 137  K 5.3*   < > 4.8   < > 2.8* 3.1* 3.2* 3.3* 3.4*  CL 106   < > 114*   < > 105 106 106 108 108  CO2 <7*   < > 6*   < > 15* 14* 15* 14* 16*  GLUCOSE 277*   < > 206*   < > 139* 155* 154* 152* 147*  BUN 14   < > 13   < > 6 6 5* <5* <5*  CREATININE 1.71*   < > 1.22   < > 0.94 0.90 0.82 0.70 0.60*  CALCIUM 9.3   < > 8.4*   < > 8.5* 8.5* 8.7* 8.4* 8.3*  MG 2.3  --   --   --  1.9  --   --   --   --   PHOS  --   --  4.3  --   --   --   --   --   --    < > = values in this interval not displayed.    Studies: No results found.  Scheduled Meds: . atorvastatin  40 mg Oral Daily  . Chlorhexidine Gluconate Cloth  6 each Topical Daily  . [START ON 02/09/2020] insulin aspart  0-15 Units Subcutaneous TID WC  . insulin aspart  0-5 Units Subcutaneous QHS  . insulin aspart  4 Units Subcutaneous TID WC  . insulin glargine  20 Units Subcutaneous Q24H  . mouth rinse  15 mL Mouth Rinse BID  . pantoprazole (PROTONIX) IV  40 mg Intravenous QHS  . pneumococcal 23 valent vaccine  0.5 mL Intramuscular Tomorrow-1000   Continuous Infusions: PRN Meds: dextrose, docusate sodium, polyethylene glycol  Time spent: 35 minutes  Author: Lynden Oxford, MD [facility] 02/08/2020 6:47 PM  To reach On-call, see care teams to locate the attending and reach out via [email or web address]. Between 7PM-7AM, please contact night-coverage If you still have difficulty reaching the attending provider, please page the [facility] (Director on Call) for [facility] on amion for assistance.

## 2020-02-09 LAB — GLUCOSE, CAPILLARY
Glucose-Capillary: 79 mg/dL (ref 70–99)
Glucose-Capillary: 82 mg/dL (ref 70–99)
Glucose-Capillary: 187 mg/dL — ABNORMAL HIGH (ref 70–99)

## 2020-02-09 LAB — BASIC METABOLIC PANEL
Anion gap: 13 (ref 5–15)
BUN: 6 mg/dL (ref 6–20)
CO2: 22 mmol/L (ref 22–32)
Calcium: 8.3 mg/dL — ABNORMAL LOW (ref 8.9–10.3)
Chloride: 105 mmol/L (ref 98–111)
Creatinine, Ser: 0.66 mg/dL (ref 0.61–1.24)
GFR calc Af Amer: >60 mL/min (ref >60)
GFR calc non Af Amer: >60 mL/min (ref >60)
Glucose, Bld: 80 mg/dL (ref 70–99)
Potassium: 3.3 mmol/L — ABNORMAL LOW (ref 3.5–5.1)
Sodium: 140 mmol/L (ref 135–145)

## 2020-02-09 LAB — CULTURE, BLOOD (ROUTINE X 2)
Culture: NO GROWTH
Culture: NO GROWTH
Special Requests: ADEQUATE
Special Requests: ADEQUATE

## 2020-02-09 MED ORDER — METOPROLOL TARTRATE 25 MG PO TABS: 12.5000 mg | ORAL_TABLET | Freq: Two times a day (BID) | ORAL | 0 refills | Status: DC

## 2020-02-09 MED ORDER — BLOOD GLUCOSE METER KIT: PACK | 0 refills | Status: DC

## 2020-02-09 MED ORDER — ACETAMINOPHEN 325 MG PO TABS: 650.0000 mg | ORAL_TABLET | Freq: Four times a day (QID) | ORAL | Status: DC | PRN

## 2020-02-09 MED ORDER — DOCUSATE SODIUM 100 MG PO CAPS
100.0000 mg | ORAL_CAPSULE | Freq: Two times a day (BID) | ORAL | Status: DC
  Administered 2020-02-09: 100 mg via ORAL
  Filled 2020-02-09: qty 1

## 2020-02-09 MED ORDER — METOPROLOL TARTRATE 12.5 MG HALF TABLET
12.5000 mg | ORAL_TABLET | Freq: Two times a day (BID) | ORAL | Status: DC
  Administered 2020-02-09: 12.5 mg via ORAL
  Filled 2020-02-09: qty 1

## 2020-02-09 MED ORDER — LACTULOSE 10 GM/15ML PO SOLN
20.0000 g | Freq: Two times a day (BID) | ORAL | Status: DC
  Administered 2020-02-09: 20 g via ORAL
  Filled 2020-02-09: qty 30

## 2020-02-09 MED ORDER — "PEN NEEDLES 3/16"" 31G X 5 MM MISC": 1.0000 | Freq: Two times a day (BID) | 0 refills | Status: DC

## 2020-02-09 MED ORDER — POTASSIUM CHLORIDE CRYS ER 20 MEQ PO TBCR
40.0000 meq | EXTENDED_RELEASE_TABLET | ORAL | Status: AC
  Administered 2020-02-09 (×2): 40 meq via ORAL
  Filled 2020-02-09 (×2): qty 2

## 2020-02-09 MED ORDER — NOVOLOG MIX 70/30 FLEXPEN (70-30) 100 UNIT/ML ~~LOC~~ SUPN: 15.0000 [IU] | PEN_INJECTOR | Freq: Two times a day (BID) | SUBCUTANEOUS | 0 refills | Status: DC

## 2020-02-09 MED ORDER — DOCUSATE SODIUM 100 MG PO CAPS: 100.0000 mg | ORAL_CAPSULE | Freq: Two times a day (BID) | ORAL | 0 refills | Status: DC

## 2020-02-09 MED ORDER — POTASSIUM CHLORIDE CRYS ER 20 MEQ PO TBCR: 40.0000 meq | EXTENDED_RELEASE_TABLET | Freq: Once | ORAL | Status: DC

## 2020-02-09 MED ORDER — PANTOPRAZOLE SODIUM 40 MG PO TBEC
40.0000 mg | DELAYED_RELEASE_TABLET | Freq: Every day | ORAL | Status: DC
  Administered 2020-02-09: 40 mg via ORAL
  Filled 2020-02-09 (×2): qty 1

## 2020-02-09 MED ORDER — POLYETHYLENE GLYCOL 3350 17 G PO PACK: 17.0000 g | PACK | Freq: Every day | ORAL | 0 refills | Status: DC | PRN

## 2020-02-09 MED FILL — NOVOLOG MIX 70-30 FLEXPEN S: (70-30) 100 | 30 days supply | Qty: 9 | Fill #0

## 2020-02-09 MED FILL — TRUEPLUS 5-BEVEL PEN NEEDLE: 31G X 5 MM | 50 days supply | Qty: 100 | Fill #0

## 2020-02-09 MED FILL — METOPROLOL TARTRATE 25 MG T: 25 | 30 days supply | Qty: 30 | Fill #0

## 2020-02-09 NOTE — Progress Notes (Addendum)
Spoke with patient in regards to using 70/30 mixed insulin pens. He feels this routine would be much better for him.  States that he could take at 8 am every morrning and every HS. Reviewed the insulin pen with him. States that he needs his other glasses at home and will have people to check behind him with the dosage. Patient did demo pen, but will need glasses to make sure dosage is correct.   Recommend:  [facility] Novolog 70/30 Mix Flexpen  insulin (order # [id number])  or Humulin Kwikpen 70/30 insulin. $10 for a month supply. Recommend 70/30 insulin 15 units at 8 am and HS Insulin pen needles 31 gauge X 5 mm (order # [id number]) Glucose meter and supplies kit (order # [id number]) $10 for kit  Noted that patient got Lantus 20 units at HS last night, so would start 70/30 insulin tonight at HS. Staff RN will follow up with having patient give own injection and will go over times to give insulin:  Recommend 70/30 insulin 15 units at 8 am and HS Reviewed hypoglycemia and how to treat.  Patient to check blood sugars at least twice a day.  Harvel Ricks RN BSN CDE Diabetes Coordinator Pager: [phone]  8am-5pm

## 2020-02-09 NOTE — Progress Notes (Signed)
Pt education completed to include future appointments, current prescriptions and medications, and doctors discharge instructions. Pt alert and oriented, MD aware of vital signs. Pt discharged with nursing staff via wheel chair.  Temp: 98.6 F (37 C) (07/15 1224) Temp Source: Oral (07/15 1224) BP: 146/109 (07/15 1320) Pulse Rate: 123 (07/15 1320)  Jessica Priest 02/09/2020 2:25 PM

## 2020-02-12 NOTE — Discharge Summary (Signed)
[facility] Discharge Summary   Larry Mclean: Larry Mclean DIY:[id number]  PCP: Larry Mclean, No Pcp Per  Date of admission: 02/04/2020   Date of discharge: 02/09/2020      Discharge Diagnoses:  Principal diagnosis DKA. Newly diagnosed diabetes mellitus Active Problems:   DKA (diabetic ketoacidoses) ([place]) Sinus tachycardia Essential hypertension  Admitted From: Home Disposition:  Home   Recommendations for Outpatient Follow-up:  1. PCP: Please follow-up with PCP in 1 week. 2. Follow up LABS/TEST: Hemoglobin A1c   Follow-up Information    [facility] Follow up.   Why: you have an appointment on 04/03/20 at 150 pm.  Contact information: [place] [id number] [phone]             Diet recommendation: Carb modified diet  Activity: The Larry Mclean is advised to gradually reintroduce usual activities, as tolerated  Discharge Condition: stable  Code Status: Full code   History of present illness: As per the H and P dictated on admission, "59 year old gentleman who was recently diagnosed with diabetes Started on new medications which he started taking about Thursday.  Has been feeling poorly since Thursday Not able to keep food down, weakness denies any fevers, no chills No contact with anybody was been sick When EMS made contact his blood sugar was 260 at the time-was noted to be lethargic, tachycardic and tachypneic"  Hospital Course:  Summary of his active problems in the hospital is as following. 1.  Diabetic ketoacidosis Newly diagnosed type 1 diabetes mellitus. Type of diabetes mellitus uncontrolled with hyperglycemia Hemoglobin A1c 14. Beta hydroxybutyric acid significantly elevated. Currently anion gap closed after initiation of the bicarb drip. Based on 95 kg weight transitioning to 0.2 units/kg of insulin 20 units Lantus. Although due to Larry Mclean's lifestyle and work preference changing from Lantus to  NPH twice daily AC for better compliance. Appreciate diabetes educator. Larry Mclean was provided education about how to check his sugar as well as how to perform insulin injection. Will not restart Jardiance at the time of the discharge. Continue Metformin at the time of the discharge.  2.  Hypokalemia Replaced.  3.  Hyperlipidemia Resume Lipitor.  4. Sinus tachycardia. Essential hypertension. Deconditioning. Larry Mclean's heart rate jumps to 140 on ambulation. Objective vitals negative. Evaluate the Larry Mclean at bedside, does not have any acute symptoms no dizziness no lightheadedness no chest pain no palpitation no nausea no vomiting. Also denies having any focal deficit. At present feel that the Larry Mclean is safe to be discharged home with return precaution and heavy work restriction. Lopressor 25 twice daily started as well.  Larry Mclean was ambulatory without any assistance. On the day of the discharge the Larry Mclean's vitals were stable, and no other acute medical condition were reported by Larry Mclean. the Larry Mclean was felt safe to be discharge at Home with no therapy needed on discharge.  Consultants: Primary admission with PCCM Procedures: none  Discharge Exam: General: Appear in no distress, no Rash; Oral Mucosa Clear, moist. Cardiovascular: S1 and S2 Present, no Murmur, Respiratory: normal respiratory effort, Bilateral Air entry present and o Crackles, no wheezes Abdomen: Bowel Sound present, Soft and no tenderness, no hernia Extremities: no Pedal edema, no calf tenderness Neurology: alert and oriented to time, place, and person affect appropriate.  Filed Weights   02/07/20 0500 02/08/20 0500 02/09/20 0500  Weight: 94.9 kg 94.9 kg 94.9 kg   Vitals:   02/09/20 1224 02/09/20 1320  BP:  (!) 146/109  Pulse:  (!) 123  Resp:    Temp: 98.6 F (37 C)   SpO2:      DISCHARGE MEDICATION: Allergies as of 02/09/2020   No Known Allergies     Medication List    STOP taking these  medications   Jardiance 25 MG Tabs tablet Generic drug: empagliflozin     TAKE these medications   atorvastatin 40 MG tablet Commonly known as: LIPITOR Take 40 mg by mouth daily.   blood glucose meter kit and supplies Dispense based on Larry Mclean and insurance preference. Use up to four times daily as directed. (FOR ICD-10 E10.9, E11.9).   docusate sodium 100 MG capsule Commonly known as: COLACE Take 1 capsule (100 mg total) by mouth 2 (two) times daily.   metFORMIN 1000 MG tablet Commonly known as: GLUCOPHAGE Take 1,000 mg by mouth 2 (two) times daily with a meal.   metoprolol tartrate 25 MG tablet Commonly known as: LOPRESSOR Take 0.5 tablets (12.5 mg total) by mouth 2 (two) times daily.   NovoLOG Mix 70/30 FlexPen (70-30) 100 UNIT/ML FlexPen Generic drug: insulin aspart protamine - aspart Inject 0.15 mLs (15 Units total) into the skin 2 (two) times daily with a meal.   Pen Needles 3/16" 31G X 5 MM Misc 1 Act by Does not apply route 2 (two) times daily.   polyethylene glycol 17 g packet Commonly known as: MIRALAX / GLYCOLAX Take 17 g by mouth daily as needed for moderate constipation.      No Known Allergies Discharge Instructions    Diet - low sodium heart healthy   Complete by: As directed    Increase activity slowly   Complete by: As directed       The results of significant diagnostics from this hospitalization (including imaging, microbiology, ancillary and laboratory) are listed below for reference.    Significant Diagnostic Studies: DG Chest Port 1 View  Result Date: 02/04/2020 CLINICAL DATA:  Acute shortness of breath. EXAM: PORTABLE CHEST 1 VIEW COMPARISON:  None. FINDINGS: The cardiomediastinal silhouette is unremarkable. There is no evidence of focal airspace disease, pulmonary edema, suspicious pulmonary nodule/mass, pleural effusion, or pneumothorax. No acute bony abnormalities are identified. IMPRESSION: No active disease. Electronically Signed   By:  Margarette Canada M.D.   On: 02/04/2020 14:47    Microbiology: Recent Results (from the past 240 hour(s))  SARS Coronavirus 2 by RT PCR (hospital order, performed in [facility] hospital lab) Nasopharyngeal Nasopharyngeal Swab     Status: None   Collection Time: 02/04/20 11:59 AM   Specimen: Nasopharyngeal Swab  Result Value Ref Range Status   SARS Coronavirus 2 NEGATIVE NEGATIVE Final    Comment: (NOTE) SARS-CoV-2 target nucleic acids are NOT DETECTED.  The SARS-CoV-2 RNA is generally detectable in upper and lower respiratory specimens during the acute phase of infection. The lowest concentration of SARS-CoV-2 viral copies this assay can detect is 250 copies / mL. A negative result does not preclude SARS-CoV-2 infection and should not be used as the sole basis for treatment or other Larry Mclean management decisions.  A negative result may occur with improper specimen collection / handling, submission of specimen other than nasopharyngeal swab, presence of viral mutation(s) within the areas targeted by this assay, and inadequate number of viral copies (<250 copies / mL). A negative result must be combined with clinical observations, Larry Mclean history, and epidemiological information.  Fact Sheet for Patients:   StrictlyIdeas.no  Fact Sheet for Healthcare Providers: BankingDealers.co.za  This test is not yet approved or  cleared by  the [employer] and has been authorized for detection and/or diagnosis of SARS-CoV-2 by FDA under an Emergency Use Authorization (EUA).  This EUA will remain in effect (meaning this test can be used) for the duration of the COVID-19 declaration under Section 564(b)(1) of the Act, 21 U.S.C. section 360bbb-3(b)(1), unless the authorization is terminated or revoked sooner.  Performed at [facility], [place] [address], [place], [place] [address]   Culture, blood (routine x 2)     Status: None    Collection Time: 02/04/20 12:59 PM   Specimen: BLOOD  Result Value Ref Range Status   Specimen Description   Final    BLOOD RIGHT WRIST Performed at [place] [address] [place], [place] [address]    Special Requests   Final    BOTTLES DRAWN AEROBIC ONLY Blood Culture adequate volume Performed at [facility] [address], [place], [place] [address]    Culture   Final    NO GROWTH 5 DAYS Performed at [facility], [place] [address] [place], [place] [address]    Report Status 02/09/2020 FINAL  Final  MRSA PCR Screening     Status: None   Collection Time: 02/04/20  3:31 PM   Specimen: Nasal Mucosa; Nasopharyngeal  Result Value Ref Range Status   MRSA by PCR NEGATIVE NEGATIVE Final    Comment:        The GeneXpert MRSA Assay (FDA approved for NASAL specimens only), is one component of a comprehensive MRSA colonization surveillance program. It is not intended to diagnose MRSA infection nor to guide or monitor treatment for MRSA infections. Performed at [facility], [place] [address] [place], [place] [address]   Culture, blood (routine x 2)     Status: None   Collection Time: 02/04/20  6:48 PM   Specimen: BLOOD RIGHT HAND  Result Value Ref Range Status   Specimen Description   Final    BLOOD RIGHT HAND Performed at [place] [address] [place], [place] [address]    Special Requests   Final    BOTTLES DRAWN AEROBIC ONLY Blood Culture adequate volume Performed at [facility] [address], [place], [place] [address]    Culture   Final    NO GROWTH 5 DAYS Performed at [facility], [place] [address] [place], [place] [address]    Report Status 02/09/2020 FINAL  Final     Labs: CBC: Recent Labs  Lab 02/07/20 0733 02/08/20 0800  WBC 4.8 5.4  HGB 12.7* 13.2  HCT 36.3* 37.5*  MCV 84.2 84.5  PLT 176 443   Basic Metabolic Panel: Recent Labs    Lab 02/07/20 0733 02/07/20 0733 02/07/20 1741 02/08/20 0800 02/08/20 1341 02/08/20 1725 02/09/20 0212  NA 137   < > 134* 137 137 137 140  K 2.8*   < > 3.1* 3.2* 3.3* 3.4* 3.3*  CL 105   < > 106 106 108 108 105  CO2 15*   < > 14* 15* 14* 16* 22  GLUCOSE 139*   < > 155* 154* 152* 147* 80  BUN 6   < > 6 5* <5* <5* 6  CREATININE 0.94   < > 0.90 0.82 0.70 0.60* 0.66  CALCIUM 8.5*   < > 8.5* 8.7* 8.4* 8.3* 8.3*  MG 1.9  --   --   --   --   --   --    < > =  values in this interval not displayed.   Liver Function Tests: No results for input(s): AST, ALT, ALKPHOS, BILITOT, PROT, ALBUMIN in the last 168 hours. No results for input(s): LIPASE, AMYLASE in the last 168 hours. No results for input(s): AMMONIA in the last 168 hours. Cardiac Enzymes: No results for input(s): CKTOTAL, CKMB, CKMBINDEX, TROPONINI in the last 168 hours. BNP (last 3 results) No results for input(s): BNP in the last 8760 hours. CBG: Recent Labs  Lab 02/08/20 1917 02/08/20 2114 02/09/20 0307 02/09/20 0732 02/09/20 1146  GLUCAP 143* 154* 79 82 187*    Time spent: 35 minutes  Signed:  Berle Mull  [facility] 02/09/2020 3:55 PM

## 2020-03-07 MED FILL — NOVOLOG MIX 70-30 FLEXPEN S: (70-30) 100 | 20 days supply | Qty: 6 | Fill #1

## 2020-04-03 ENCOUNTER — Ambulatory Visit: Payer: 59 | Attending: Nurse Practitioner | Admitting: Nurse Practitioner

## 2020-04-03 ENCOUNTER — Other Ambulatory Visit: Payer: Self-pay

## 2020-04-03 ENCOUNTER — Other Ambulatory Visit: Payer: Self-pay | Admitting: Nurse Practitioner

## 2020-04-03 ENCOUNTER — Encounter: Payer: Self-pay | Admitting: Nurse Practitioner

## 2020-04-03 VITALS — BP 132/86 | HR 79 | Temp 97.7°F | Ht 75.0 in | Wt 218.0 lb

## 2020-04-03 DIAGNOSIS — I10 Essential (primary) hypertension: Secondary | ICD-10-CM | POA: Diagnosis not present

## 2020-04-03 DIAGNOSIS — Z794 Long term (current) use of insulin: Secondary | ICD-10-CM

## 2020-04-03 DIAGNOSIS — E0810 Diabetes mellitus due to underlying condition with ketoacidosis without coma: Secondary | ICD-10-CM | POA: Diagnosis not present

## 2020-04-03 DIAGNOSIS — E785 Hyperlipidemia, unspecified: Secondary | ICD-10-CM

## 2020-04-03 DIAGNOSIS — R002 Palpitations: Secondary | ICD-10-CM

## 2020-04-03 DIAGNOSIS — Z1211 Encounter for screening for malignant neoplasm of colon: Secondary | ICD-10-CM

## 2020-04-03 DIAGNOSIS — E1165 Type 2 diabetes mellitus with hyperglycemia: Secondary | ICD-10-CM | POA: Diagnosis not present

## 2020-04-03 DIAGNOSIS — Z1159 Encounter for screening for other viral diseases: Secondary | ICD-10-CM

## 2020-04-03 DIAGNOSIS — Z125 Encounter for screening for malignant neoplasm of prostate: Secondary | ICD-10-CM

## 2020-04-03 LAB — GLUCOSE, POCT (MANUAL RESULT ENTRY): POC Glucose: 136 mg/dl — AB (ref 70–99)

## 2020-04-03 MED ORDER — "PEN NEEDLES 3/16"" 31G X 5 MM MISC": 1.0000 | Freq: Two times a day (BID) | 0 refills | Status: DC

## 2020-04-03 MED ORDER — LISINOPRIL 5 MG PO TABS: 5.0000 mg | ORAL_TABLET | Freq: Every day | ORAL | 3 refills | Status: DC

## 2020-04-03 MED ORDER — METFORMIN HCL 1000 MG PO TABS: 1000.0000 mg | ORAL_TABLET | Freq: Two times a day (BID) | ORAL | 1 refills | Status: DC

## 2020-04-03 MED ORDER — BLOOD GLUCOSE METER KIT: PACK | 0 refills | Status: AC

## 2020-04-03 MED ORDER — NOVOLOG MIX 70/30 FLEXPEN (70-30) 100 UNIT/ML ~~LOC~~ SUPN: 15.0000 [IU] | PEN_INJECTOR | Freq: Two times a day (BID) | SUBCUTANEOUS | 0 refills | Status: DC

## 2020-04-03 MED ORDER — BLOOD GLUCOSE METER KIT: PACK | 0 refills | Status: DC

## 2020-04-03 MED ORDER — ATORVASTATIN CALCIUM 40 MG PO TABS: 40.0000 mg | ORAL_TABLET | Freq: Every day | ORAL | 0 refills | Status: DC

## 2020-04-03 MED FILL — TRUEplus LANCETS 28G MISC: 25 days supply | Qty: 100 | Fill #0

## 2020-04-03 MED FILL — metFORMIN HCL 1000 MG TABS: 1000 | 30 days supply | Qty: 60 | Fill #0

## 2020-04-03 MED FILL — TRUEPLUS 5-BEVEL PEN NEEDLE: 31G X 5 MM | 50 days supply | Qty: 100 | Fill #0

## 2020-04-03 MED FILL — LISINOPRIL 5 MG TABLET: 5 | 90 days supply | Qty: 90 | Fill #0

## 2020-04-03 MED FILL — ATORVASTATIN CALCIUM 40 MG: 40 | 30 days supply | Qty: 30 | Fill #0

## 2020-04-03 MED FILL — TRUE METRIX TEST STRIP: 25 days supply | Qty: 100 | Fill #0

## 2020-04-03 MED FILL — !TRUE METRIX BLOOD GLUCOSE: 1 days supply | Qty: 1 | Fill #0

## 2020-04-03 MED FILL — NOVOLOG MIX 70-30 FLEXPEN S: (70-30) 100 | 30 days supply | Qty: 9 | Fill #0

## 2020-04-03 NOTE — Progress Notes (Signed)
Assessment & Plan:  Larry Mclean was seen today for new patient (initial visit).  Diagnoses and all orders for this visit:  Type 2 diabetes mellitus with hyperglycemia, with long-term current use of insulin (HCC) -     Glucose (CBG) -     Microalbumin/Creatinine Ratio, Urine -     CMP14+EGFR -     TSH -     Lipid panel -     Ambulatory referral to Ophthalmology -     metFORMIN (GLUCOPHAGE) 1000 MG tablet; Take 1 tablet (1,000 mg total) by mouth 2 (two) times daily with a meal. -     insulin aspart protamine - aspart (NOVOLOG MIX 70/30 FLEXPEN) (70-30) 100 UNIT/ML FlexPen; Inject 0.15 mLs (15 Units total) into the skin 2 (two) times daily with a meal. -     Insulin Pen Needle (PEN NEEDLES 3/16") 31G X 5 MM MISC; 1 Act by Does not apply route 2 (two) times daily. -     blood glucose meter kit and supplies; Dispense based on patient and insurance preference. Use up to four times daily as directed. (FOR ICD-10 E10.9, E11.9). Continue blood sugar control as discussed in office today, low carbohydrate diet, and regular physical exercise as tolerated, 150 minutes per week (30 min each day, 5 days per week, or 50 min 3 days per week). Keep blood sugar logs with fasting goal of 90-130 mg/dl, post prandial (after you eat) less than 180.  For Hypoglycemia: BS <60 and Hyperglycemia BS >400; contact the clinic ASAP. Annual eye exams and foot exams are recommended.   Essential hypertension -     CMP14+EGFR -     lisinopril (ZESTRIL) 5 MG tablet; Take 1 tablet (5 mg total) by mouth daily. Continue all antihypertensives as prescribed.  Remember to bring in your blood pressure log with you for your follow up appointment.  DASH/Mediterranean Diets are healthier choices for HTN.    Dyslipidemia, goal LDL below 70 -     Lipid panel -     atorvastatin (LIPITOR) 40 MG tablet; Take 1 tablet (40 mg total) by mouth daily. INSTRUCTIONS: Work on a low fat, heart healthy diet and participate in regular aerobic  exercise program by working out at least 150 minutes per week; 5 days a week-30 minutes per day. Avoid red meat/beef/steak,  fried foods. junk foods, sodas, sugary drinks, unhealthy snacking, alcohol and smoking.  Drink at least 80 oz of water per day and monitor your carbohydrate intake daily.    Prostate cancer screening -     PSA  Colon cancer screening -     Fecal occult blood, imunochemical([facility])  Need for hepatitis C screening test -     Hepatitis C Antibody  Heart palpitations -     TSH    Patient has been counseled on age-appropriate routine health concerns for screening and prevention. These are reviewed and up-to-date. Referrals have been placed accordingly. Immunizations are up-to-date or declined.    Subjective:   Chief Complaint  Patient presents with   New Patient (Initial Visit)    Pt. is here to establish care.    HPI Larry Mclean 59 y.o. male presents to office today to establish care.   has a past medical history of Allergy, DM (diabetes mellitus), type 2, uncontrolled with complications ([place]), Hyperlipidemia, and Hypertension.  Sometimes feels heart racing. Denies any shortness of breath or chest pain. Symptoms have not occurred in 2 weeks.    DM TYPE 2 Home Blood  glucose reading 140 yesterday morning. He is working out and walking 40 minutes every morning. LDL not at goal. Current medications include novolog 70/30 15 units BID. Will restart metformin 1000 mg BID.  Lab Results  Component Value Date   HGBA1C 14.5 (H) 02/04/2020   Lab Results  Component Value Date   LDLCALC 121 (H) 04/03/2020    Essential Hypertension Blood pressure is well controlled. Prescribing renal dose lisinopril 5 mg daily. Denies chest pain, shortness of breath, palpitations, lightheadedness, dizziness, headaches or BLE edema.  BP Readings from Last 3 Encounters:  04/03/20 132/86  02/09/20 (!) 146/109  10/04/14 120/80   Review of Systems  Constitutional:  Negative for fever, malaise/fatigue and weight loss.  HENT: Negative.  Negative for nosebleeds.   Eyes: Negative.  Negative for blurred vision, double vision and photophobia.  Respiratory: Negative.  Negative for cough and shortness of breath.   Cardiovascular: Negative.  Negative for chest pain, palpitations and leg swelling.  Gastrointestinal: Negative.  Negative for heartburn, nausea and vomiting.  Musculoskeletal: Negative.  Negative for myalgias.  Neurological: Negative.  Negative for dizziness, focal weakness, seizures and headaches.  Psychiatric/Behavioral: Negative.  Negative for suicidal ideas.    Past Medical History:  Diagnosis Date   Allergy    DM (diabetes mellitus), type 2, uncontrolled with complications ([place])    Hyperlipidemia    Hypertension     Past Surgical History:  Procedure Laterality Date   NO PAST SURGERIES      Family History  Problem Relation Age of Onset   Diabetes Neg Hx     Social History Reviewed with no changes to be made today.   Outpatient Medications Prior to Visit  Medication Sig Dispense Refill   blood glucose meter kit and supplies Dispense based on patient and insurance preference. Use up to four times daily as directed. (FOR ICD-10 E10.9, E11.9). 1 each 0   insulin aspart protamine - aspart (NOVOLOG MIX 70/30 FLEXPEN) (70-30) 100 UNIT/ML FlexPen Inject 0.15 mLs (15 Units total) into the skin 2 (two) times daily with a meal. 15 mL 0   Insulin Pen Needle (PEN NEEDLES 3/16") 31G X 5 MM MISC 1 Act by Does not apply route 2 (two) times daily. 120 each 0   atorvastatin (LIPITOR) 40 MG tablet Take 40 mg by mouth daily. (Patient not taking: Reported on 04/03/2020)     docusate sodium (COLACE) 100 MG capsule Take 1 capsule (100 mg total) by mouth 2 (two) times daily. (Patient not taking: Reported on 04/03/2020) 10 capsule 0   metFORMIN (GLUCOPHAGE) 1000 MG tablet Take 1,000 mg by mouth 2 (two) times daily with a meal. (Patient not taking:  Reported on 04/03/2020)     metoprolol tartrate (LOPRESSOR) 25 MG tablet Take 0.5 tablets (12.5 mg total) by mouth 2 (two) times daily. (Patient not taking: Reported on 04/03/2020) 30 tablet 0   polyethylene glycol (MIRALAX / GLYCOLAX) 17 g packet Take 17 g by mouth daily as needed for moderate constipation. (Patient not taking: Reported on 04/03/2020) 14 each 0   No facility-administered medications prior to visit.    No Known Allergies     Objective:    BP 132/86 (BP Location: Left Arm, Patient Position: Sitting, Cuff Size: Normal)    Pulse 79    Temp 97.7 F (36.5 C) (Temporal)    Ht 6' 3"  (1.905 m)    Wt 218 lb (98.9 kg)    SpO2 99%    BMI 27.25 kg/m  Wt  Readings from Last 3 Encounters:  04/03/20 218 lb (98.9 kg)  02/09/20 209 lb 3.5 oz (94.9 kg)  10/04/14 228 lb (103.4 kg)    Physical Exam Vitals and nursing note reviewed.  Constitutional:      Appearance: He is well-developed.  HENT:     Head: Normocephalic and atraumatic.  Cardiovascular:     Rate and Rhythm: Normal rate and regular rhythm.     Heart sounds: Normal heart sounds. No murmur heard.  No friction rub. No gallop.   Pulmonary:     Effort: Pulmonary effort is normal. No tachypnea or respiratory distress.     Breath sounds: Normal breath sounds. No decreased breath sounds, wheezing, rhonchi or rales.  Chest:     Chest wall: No tenderness.  Abdominal:     General: Bowel sounds are normal.     Palpations: Abdomen is soft.  Musculoskeletal:        General: Normal range of motion.     Cervical back: Normal range of motion.  Skin:    General: Skin is warm and dry.  Neurological:     Mental Status: He is alert and oriented to person, place, and time.     Coordination: Coordination normal.  Psychiatric:        Behavior: Behavior normal. Behavior is cooperative.        Thought Content: Thought content normal.        Judgment: Judgment normal.          Patient has been counseled extensively about nutrition  and exercise as well as the importance of adherence with medications and regular follow-up. The patient was given clear instructions to go to ER or return to medical center if symptoms don't improve, worsen or new problems develop. The patient verbalized understanding.   Follow-up: Return in about 3 weeks (around 04/24/2020) for meter check with [facility] 3 weeks. See me in 3 months. Gildardo Pounds, FNP-BC [facility] [place], [place]   04/05/2020, 6:36 PM

## 2020-04-04 LAB — LIPID PANEL
Chol/HDL Ratio: 4.6 ratio (ref 0.0–5.0)
Cholesterol, Total: 182 mg/dL (ref 100–199)
HDL: 40 mg/dL (ref >39)
LDL Chol Calc (NIH): 121 mg/dL — ABNORMAL HIGH (ref 0–99)
Triglycerides: 116 mg/dL (ref 0–149)
VLDL Cholesterol Cal: 21 mg/dL (ref 5–40)

## 2020-04-04 LAB — CMP14+EGFR
ALT: 18 IU/L (ref 0–44)
AST: 20 IU/L (ref 0–40)
Albumin/Globulin Ratio: 1.1 — ABNORMAL LOW (ref 1.2–2.2)
Albumin: 4.2 g/dL (ref 3.8–4.9)
Alkaline Phosphatase: 61 IU/L (ref 48–121)
BUN/Creatinine Ratio: 10 (ref 9–20)
BUN: 11 mg/dL (ref 6–24)
Bilirubin Total: 0.3 mg/dL (ref 0.0–1.2)
CO2: 25 mmol/L (ref 20–29)
Calcium: 9.9 mg/dL (ref 8.7–10.2)
Chloride: 105 mmol/L (ref 96–106)
Creatinine, Ser: 1.09 mg/dL (ref 0.76–1.27)
GFR calc Af Amer: 85 mL/min/{1.73_m2} (ref >59)
GFR calc non Af Amer: 74 mL/min/{1.73_m2} (ref >59)
Globulin, Total: 3.7 g/dL (ref 1.5–4.5)
Glucose: 118 mg/dL — ABNORMAL HIGH (ref 65–99)
Potassium: 4.4 mmol/L (ref 3.5–5.2)
Sodium: 139 mmol/L (ref 134–144)
Total Protein: 7.9 g/dL (ref 6.0–8.5)

## 2020-04-04 LAB — MICROALBUMIN / CREATININE URINE RATIO
Creatinine, Urine: 169.5 mg/dL
Microalb/Creat Ratio: 2 mg/g creat (ref 0–29)
Microalbumin, Urine: 3.9 ug/mL

## 2020-04-04 LAB — TSH: TSH: 3 u[IU]/mL (ref 0.450–4.500)

## 2020-04-04 LAB — HEPATITIS C ANTIBODY: Hep C Virus Ab: 0.2 s/co ratio (ref 0.0–0.9)

## 2020-04-04 LAB — PSA: Prostate Specific Ag, Serum: 0.6 ng/mL (ref 0.0–4.0)

## 2020-04-05 ENCOUNTER — Encounter: Payer: Self-pay | Admitting: Nurse Practitioner

## 2020-04-11 LAB — FECAL OCCULT BLOOD, IMMUNOCHEMICAL: Fecal Occult Bld: NEGATIVE

## 2020-04-23 NOTE — Progress Notes (Signed)
S:    PCP: Zelda  Patient arrives in good spirits. Presents for diabetes evaluation, education, and management.  Patient was referred and last seen by Primary Care Provider on 04/03/20. Of note, patient was admitted 7/10-7/15/21 for DKA at which time Novolog 70/30 was started, metformin was continued, and Jardiance was discontinued.   Today, patient reports improvement in blood sugars and satisfaction with medication regimen. Reports positive lifestyle changes as well. He brings his glucometer and blood sugar log with him today. Reports multiple instances of hypoglycemia since last visit.   Family/Social History:  -Fhx: unknown -Tobacco: never smoker  [employer] affordability: [facility]  Medication adherence reported as daily - denies missed doses.   Current diabetes medications include: metformin 1000 mg BID, Novolog 70/30 15 units BID Current hypertension medications include: lisinopril 5 mg daily 15 units BID Current hyperlipidemia medications include: atorvastatin 40 mg daily  Previous diabetes medications: Jardiance (discontinued at 02/09/20 discharge following DKA hospitalization)   Patient reports hypoglycemic events. Has occurred 2-3 times since last visit and resolves with eating. Usually happens in the afternoon when he is hungry and did not eat much for lunch.   Patient reported dietary habits: Eats 3 meals/day Breakfast: eggs and cheese Lunch: rice, chicken (doesn't eat much for lunch) Dinner: rice, vegetables, chicken Drinks: water, green tea, stopped drinking sweet tea  Patient-reported exercise habits: 20 minutes of walking every day   Patient denies nocturia (nighttime urination).  Patient denies neuropathy (nerve pain). Patient denies visual changes. Patient reports self foot exams.    O:  POCT BG: 124  Lab Results  Component Value Date   HGBA1C 14.5 (H) 02/04/2020   There were no vitals filed for this visit.  Lipid Panel       Component Value Date/Time   CHOL 182 04/03/2020 1452   TRIG 116 04/03/2020 1452   HDL 40 04/03/2020 1452   CHOLHDL 4.6 04/03/2020 1452   CHOLHDL 3.9 08/15/2014 1500   VLDL 28 08/15/2014 1500   LDLCALC 121 (H) 04/03/2020 1452   Home fasting blood sugars: before lunch - 97, 98, 99, 125, 96, 85 2 hour post-meal/random blood sugars: 1 hour after dinner - 100, 108, 117, 82, 72, 95, 103 Glucometer: 7 day average 123, 14 average 108, 30 average 106  Clinical Atherosclerotic Cardiovascular Disease (ASCVD): No  The 10-year ASCVD risk score Denman George DC Jr., et al., 2013) is: 25.2%   Values used to calculate the score:     Age: 16 years     Sex: Male     Is Non-Hispanic African American: Yes     Diabetic: Yes     Tobacco smoker: No     Systolic Blood Pressure: 132 mmHg     Is BP treated: Yes     HDL Cholesterol: 40 mg/dL     Total Cholesterol: 182 mg/dL   A/P: Most recent T5H 02/04/20 above goal at 14.5, however home blood sugars have since improved and are now at goal. Patient is able to verbalize appropriate hypoglycemia management plan. Medication adherence appears appropriate. Due to multiple reported symptomatic lows and blood sugars continually at low end of goal range, will discontinue 70/30 insulin and switch to equivalent long acting dose to lower risk of hypoglycemia. Patient was very pleased with this plan and excited decreasing number of daily injections.  -Discontinue Novolog 70/30  -Initiate Lantus 20 units daily in the morning -Continue metformin 1000 mg BID -Continue checking blood sugars at home, preferably in the morning  before breakfast and two hours after dinner. Instructed to bring glucometer and log to next visit.  -Discussed hypoglycemia management plan (Rule of 15). -Patient instructed to call pharmacist if patient continues to have low blood sugars with new insulin regimen.  -Extensively discussed pathophysiology of diabetes, recommended lifestyle interventions, dietary  effects on blood sugar control -Counseled on s/sx of and management of hypoglycemia -Next A1C anticipated 05/08/20 at follow up visit.   Written patient instructions provided. Total time in face to face counseling 30 minutes.   Follow up Pharmacist Clinic Visit in 2 weeks.     Pervis Hocking, PharmD PGY1 Pharmacy Resident 04/24/2020 10:20 AM

## 2020-04-24 ENCOUNTER — Other Ambulatory Visit: Payer: Self-pay | Admitting: Family Medicine

## 2020-04-24 ENCOUNTER — Other Ambulatory Visit: Payer: Self-pay

## 2020-04-24 ENCOUNTER — Encounter: Payer: Self-pay | Admitting: Pharmacist

## 2020-04-24 ENCOUNTER — Ambulatory Visit: Payer: 59 | Attending: Nurse Practitioner | Admitting: Pharmacist

## 2020-04-24 DIAGNOSIS — E1165 Type 2 diabetes mellitus with hyperglycemia: Secondary | ICD-10-CM

## 2020-04-24 DIAGNOSIS — Z794 Long term (current) use of insulin: Secondary | ICD-10-CM

## 2020-04-24 LAB — GLUCOSE, POCT (MANUAL RESULT ENTRY): POC Glucose: 124 mg/dl — AB (ref 70–99)

## 2020-04-24 MED ORDER — LANTUS SOLOSTAR 100 UNIT/ML ~~LOC~~ SOPN: 20.0000 [IU] | PEN_INJECTOR | Freq: Every day | SUBCUTANEOUS | 1 refills | Status: DC

## 2020-04-24 MED ORDER — TRUEPLUS PEN NEEDLES 32G X 4 MM MISC: 2 refills | Status: AC

## 2020-04-24 MED FILL — LANTUS SOLOSTAR 100 UNITS/M: 100 | 30 days supply | Qty: 6 | Fill #0

## 2020-04-24 MED FILL — TRUEplus 5-BEVEL PEN NEEDLE: 32G X 4 MM | 25 days supply | Qty: 100 | Fill #0

## 2020-05-08 ENCOUNTER — Ambulatory Visit: Payer: 59 | Attending: Nurse Practitioner | Admitting: Pharmacist

## 2020-05-08 ENCOUNTER — Other Ambulatory Visit: Payer: Self-pay

## 2020-05-08 ENCOUNTER — Encounter: Payer: Self-pay | Admitting: Pharmacist

## 2020-05-08 DIAGNOSIS — Z794 Long term (current) use of insulin: Secondary | ICD-10-CM | POA: Diagnosis not present

## 2020-05-08 DIAGNOSIS — E1165 Type 2 diabetes mellitus with hyperglycemia: Secondary | ICD-10-CM | POA: Diagnosis not present

## 2020-05-08 LAB — POCT GLYCOSYLATED HEMOGLOBIN (HGB A1C): Hemoglobin A1C: 5.4 % (ref 4.0–5.6)

## 2020-05-08 LAB — GLUCOSE, POCT (MANUAL RESULT ENTRY): POC Glucose: 124 mg/dl — AB (ref 70–99)

## 2020-05-08 MED FILL — TRUEplus LANCETS 28G MISC: 25 days supply | Qty: 100 | Fill #1

## 2020-05-08 MED FILL — ATORVASTATIN CALCIUM 40 MG: 40 | 30 days supply | Qty: 30 | Fill #1

## 2020-05-08 MED FILL — TRUE METRIX TEST STRIP: 25 days supply | Qty: 100 | Fill #1

## 2020-05-08 MED FILL — METFORMIN HCL 1000 MG TABS: 1000 | 30 days supply | Qty: 60 | Fill #1

## 2020-05-08 NOTE — Progress Notes (Signed)
S:    PCP: Zelda  Patient arrives in good spirits. Presents for diabetes evaluation, education, and management.  Patient was referred and last seen by Primary Care Provider on 04/03/20. Patient previously admitted 7/10-7/15/21 for DKA at which time Novolog 70/30 was started, metformin was continued, and Jardiance was discontinued. Last seen by clinical pharmacist on 04/24/20. At this visit, Novolog 70/30 was discontinued d/t frequent hypoglycemic events; Lantus was initiated.   Today, patient reports improvement in blood sugars and satisfaction with medication regimen. Reports positive lifestyle changes as well. He brings his glucometer and blood sugar log with him today (measurements shown below). Reports hypoglycemic episodes have resolved.   Family/Social History:  -Fhx: unknown -Tobacco: never smoker  [employer] affordability: [facility]  Medication adherence reported as daily - denies missed doses.   Current diabetes medications: metformin 1000 mg BID, Lantus 20 units in AM Current hypertension medications include: lisinopril 5 mg daily 15 units BID Current hyperlipidemia medications include: atorvastatin 40 mg daily  Previous diabetes medications: Jardiance (discontinued at 02/09/20 discharge following DKA hospitalization, Novolog - d/c due to hypoglycemia)   Patient reported dietary habits: Eats 3 meals/day Breakfast: eggs and cheese Lunch: rice, chicken (doesn't eat much for lunch) Dinner: rice, vegetables, chicken Drinks: water, green tea, stopped drinking sweet tea  Patient-reported exercise habits: 20 minutes of walking every day   Patient denies nocturia (nighttime urination).  Patient denies neuropathy (nerve pain). Patient denies visual changes. Patient reports self foot exams.    O:  POCT BG: 124  Lab Results  Component Value Date   HGBA1C 5.4 05/08/2020   There were no vitals filed for this visit.  Lipid Panel     Component Value  Date/Time   CHOL 182 04/03/2020 1452   TRIG 116 04/03/2020 1452   HDL 40 04/03/2020 1452   CHOLHDL 4.6 04/03/2020 1452   CHOLHDL 3.9 08/15/2014 1500   VLDL 28 08/15/2014 1500   LDLCALC 121 (H) 04/03/2020 1452   Presented with glucometer today. Fasting home blood sugar level are the following: 10/12 95  10/11 93  10/10  75  10/9 76  10/8 88  10/7 106  10/6 87  10/5 81  10/4 76     Clinical Atherosclerotic Cardiovascular Disease (ASCVD): No  The 10-year ASCVD risk score Denman George DC Jr., et al., 2013) is: 25.2%   Values used to calculate the score:     Age: 59 years     Sex: Male     Is Non-Hispanic African American: Yes     Diabetic: Yes     Tobacco smoker: No     Systolic Blood Pressure: 132 mmHg     Is BP treated: Yes     HDL Cholesterol: 40 mg/dL     Total Cholesterol: 182 mg/dL  - On high intensity statin with atorvastatin 40 mg daily   A/P: Diabetes appears now controlled with a POCT A1c today of 5.4 (reduced from 14.5 in July 2021). Per home glucometer, FBG is very well controlled. Patient denies any hypoglycemic symptoms. Patient is able to verbalize appropriate hypoglycemia management plan. Medication adherence appears appropriate. Patient was very pleased with A1c results today.  -Continue metformin 1000 mg BID.  -Continue Lantus 20 units daily in the morning. If BG drops to 70 or below (or becomes symptomatic), reduce morning insulin to 18 units each morning.  -Continue checking blood sugars at home, preferably in the morning before breakfast and two hours after dinner. -Discussed hypoglycemia management  plan (Rule of 15). -Extensively discussed pathophysiology of diabetes, recommended lifestyle interventions, dietary effects on blood sugar control -Counseled on s/sx of and management of hypoglycemia -Follow up with PCP visit on 07/03/20 with Bertram Denver  -POCT A1c  Written patient instructions provided. Total time in face to face counseling 15 minutes.   Follow  up with PCP on 07/03/20.    Theodis Sato, PharmD PGY-1 [facility] Pharmacy Resident   05/08/2020 2:01 PM

## 2020-05-24 MED FILL — LANTUS SOLOSTAR 100 UNITS/M: 100 | 30 days supply | Qty: 6 | Fill #1

## 2020-06-08 MED FILL — ATORVASTATIN CALCIUM 40 MG: 40 | 30 days supply | Qty: 30 | Fill #2

## 2020-06-08 MED FILL — METFORMIN HCL 1000 MG TABS: 1000 | 30 days supply | Qty: 60 | Fill #2

## 2020-06-25 MED FILL — LANTUS SOLOSTAR 100 UNITS/M: 100 | 30 days supply | Qty: 6 | Fill #2

## 2020-07-03 ENCOUNTER — Ambulatory Visit: Payer: 59 | Attending: Nurse Practitioner | Admitting: Nurse Practitioner

## 2020-07-03 ENCOUNTER — Other Ambulatory Visit: Payer: Self-pay

## 2020-07-13 ENCOUNTER — Other Ambulatory Visit: Payer: Self-pay | Admitting: Nurse Practitioner

## 2020-07-13 DIAGNOSIS — E785 Hyperlipidemia, unspecified: Secondary | ICD-10-CM

## 2020-07-13 MED FILL — LISINOPRIL 5 MG TABLET: 5 | 90 days supply | Qty: 90 | Fill #1

## 2020-07-13 MED FILL — METFORMIN HCL 1000 MG TABS: 1000 | 30 days supply | Qty: 60 | Fill #3

## 2020-07-13 MED FILL — ATORVASTATIN CALCIUM 40 MG: 40 | 90 days supply | Qty: 90 | Fill #0

## 2020-09-13 ENCOUNTER — Encounter: Payer: Self-pay | Admitting: Pharmacist

## 2022-06-18 IMAGING — DX DG CHEST 1V PORT
1 series · 1 of 1 positions shown · non-contrast
Comparison: None.

CLINICAL DATA: Acute shortness of breath.

EXAM:
PORTABLE CHEST 1 VIEW

[chest ap]
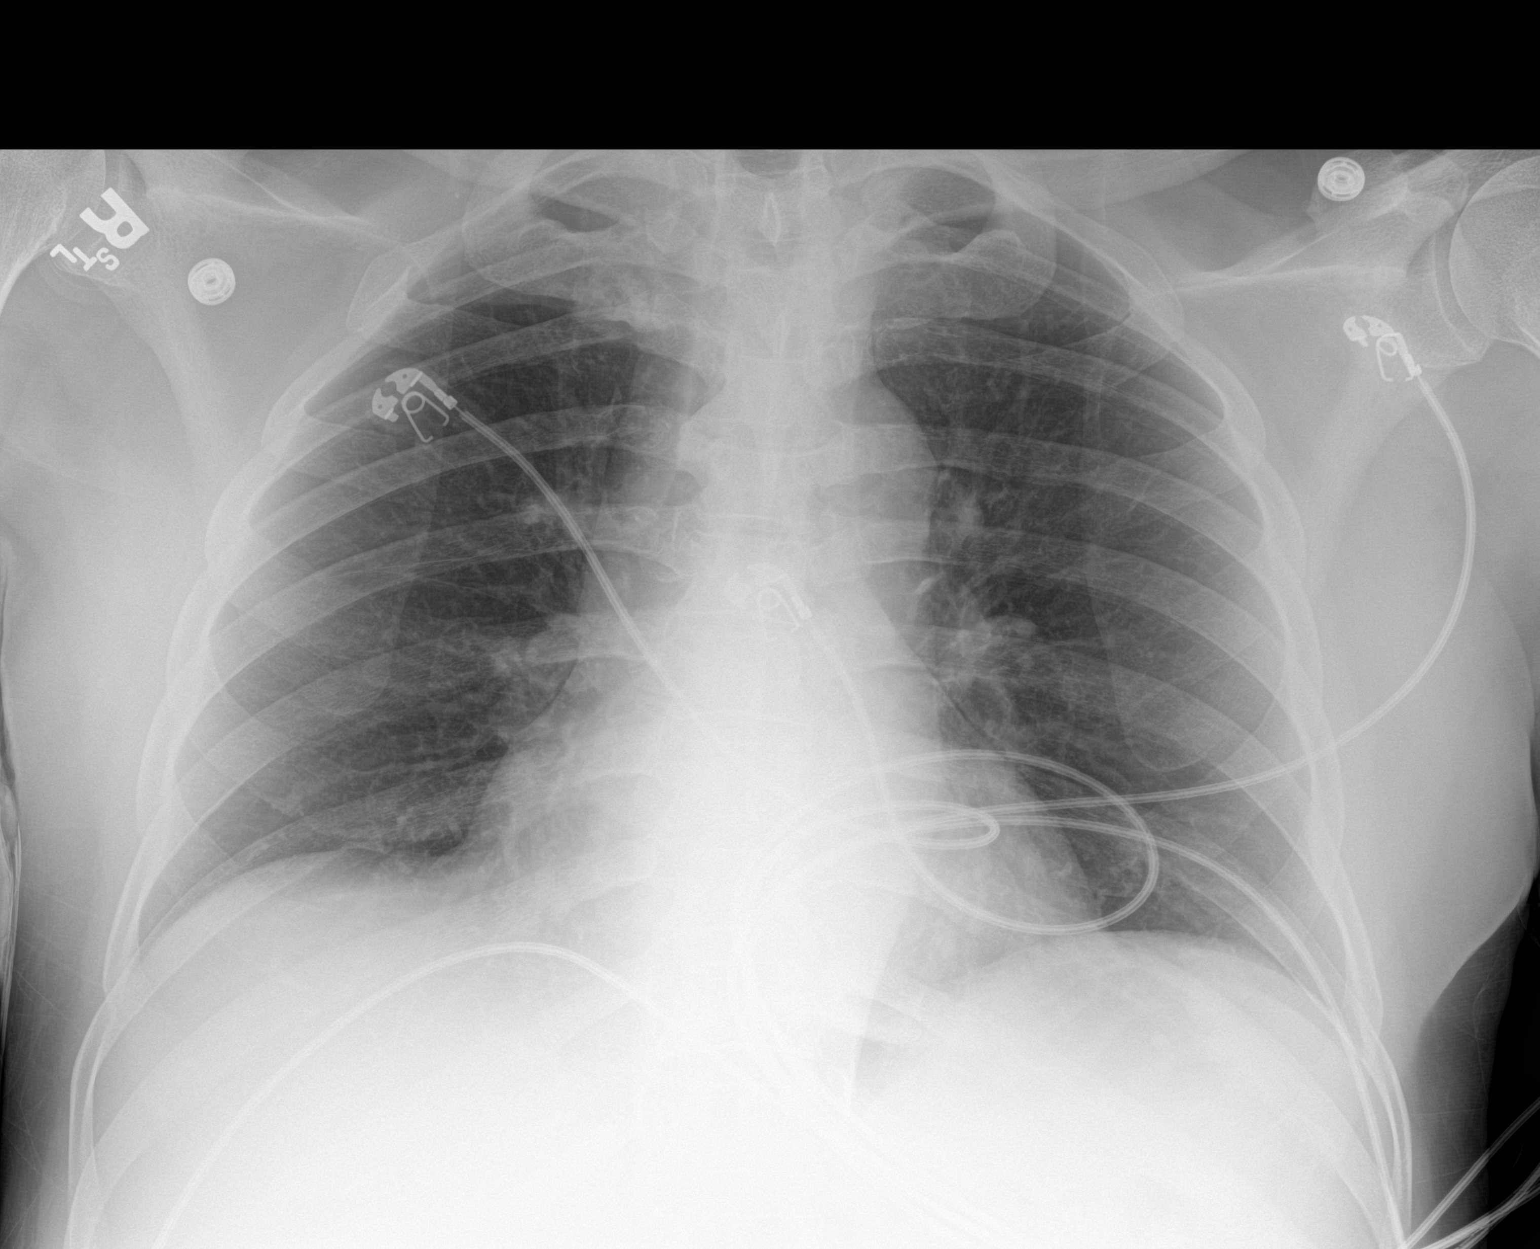

[1 of 1 positions shown; findings below may reference images not displayed]

FINDINGS: The cardiomediastinal silhouette is unremarkable.

There is no evidence of focal airspace disease, pulmonary edema,
suspicious pulmonary nodule/mass, pleural effusion, or pneumothorax.

No acute bony abnormalities are identified.
IMPRESSION: No active disease.
